# Patient Record
Sex: Female | Born: 1970 | Race: White | Hispanic: No | Marital: Married | Smoking: Never smoker
Health system: Southern US, Community
[De-identification: ages and names within clinical notes are randomized; demographics above are authoritative.]

## PROBLEM LIST (undated history)

## (undated) DIAGNOSIS — B009 Herpesviral infection, unspecified: Secondary | ICD-10-CM

## (undated) DIAGNOSIS — N946 Dysmenorrhea, unspecified: Secondary | ICD-10-CM

## (undated) DIAGNOSIS — D649 Anemia, unspecified: Secondary | ICD-10-CM

## (undated) DIAGNOSIS — IMO0002 Reserved for concepts with insufficient information to code with codable children: Secondary | ICD-10-CM

## (undated) DIAGNOSIS — D219 Benign neoplasm of connective and other soft tissue, unspecified: Secondary | ICD-10-CM

## (undated) DIAGNOSIS — D242 Benign neoplasm of left breast: Secondary | ICD-10-CM

## (undated) DIAGNOSIS — R87619 Unspecified abnormal cytological findings in specimens from cervix uteri: Secondary | ICD-10-CM

## (undated) DIAGNOSIS — R87629 Unspecified abnormal cytological findings in specimens from vagina: Secondary | ICD-10-CM

## (undated) DIAGNOSIS — N920 Excessive and frequent menstruation with regular cycle: Secondary | ICD-10-CM

## (undated) HISTORY — DX: Excessive and frequent menstruation with regular cycle: N92.0

## (undated) HISTORY — DX: Reserved for concepts with insufficient information to code with codable children: IMO0002

## (undated) HISTORY — DX: Unspecified abnormal cytological findings in specimens from vagina: R87.629

## (undated) HISTORY — PX: BREAST FIBROADENOMA SURGERY: SHX580

## (undated) HISTORY — DX: Benign neoplasm of left breast: D24.2

## (undated) HISTORY — DX: Benign neoplasm of connective and other soft tissue, unspecified: D21.9

## (undated) HISTORY — DX: Herpesviral infection, unspecified: B00.9

## (undated) HISTORY — DX: Unspecified abnormal cytological findings in specimens from cervix uteri: R87.619

## (undated) HISTORY — DX: Dysmenorrhea, unspecified: N94.6

---

## 2000-07-17 ENCOUNTER — Other Ambulatory Visit: Admission: RE | Admit: 2000-07-17 | Discharge: 2000-07-17 | Payer: Self-pay | Admitting: Obstetrics and Gynecology

## 2004-05-21 ENCOUNTER — Ambulatory Visit (HOSPITAL_COMMUNITY): Admission: RE | Admit: 2004-05-21 | Discharge: 2004-05-21 | Payer: Self-pay | Admitting: Family Medicine

## 2005-09-25 ENCOUNTER — Ambulatory Visit (HOSPITAL_COMMUNITY): Admission: RE | Admit: 2005-09-25 | Discharge: 2005-09-25 | Payer: Self-pay | Admitting: Family Medicine

## 2005-10-05 ENCOUNTER — Emergency Department (HOSPITAL_COMMUNITY): Admission: EM | Admit: 2005-10-05 | Discharge: 2005-10-05 | Payer: Self-pay | Admitting: Emergency Medicine

## 2007-03-08 ENCOUNTER — Other Ambulatory Visit: Admission: RE | Admit: 2007-03-08 | Discharge: 2007-03-08 | Payer: Self-pay | Admitting: Obstetrics and Gynecology

## 2007-03-08 LAB — CONVERTED CEMR LAB: Pap Smear: NORMAL

## 2007-05-06 ENCOUNTER — Ambulatory Visit: Payer: Self-pay | Admitting: Family Medicine

## 2007-05-06 DIAGNOSIS — J069 Acute upper respiratory infection, unspecified: Secondary | ICD-10-CM | POA: Insufficient documentation

## 2007-05-11 ENCOUNTER — Encounter (INDEPENDENT_AMBULATORY_CARE_PROVIDER_SITE_OTHER): Payer: Self-pay | Admitting: Family Medicine

## 2007-12-13 ENCOUNTER — Ambulatory Visit: Payer: Self-pay | Admitting: Family Medicine

## 2007-12-13 DIAGNOSIS — J00 Acute nasopharyngitis [common cold]: Secondary | ICD-10-CM | POA: Insufficient documentation

## 2008-02-15 ENCOUNTER — Telehealth (INDEPENDENT_AMBULATORY_CARE_PROVIDER_SITE_OTHER): Payer: Self-pay | Admitting: Internal Medicine

## 2008-04-03 ENCOUNTER — Other Ambulatory Visit: Admission: RE | Admit: 2008-04-03 | Discharge: 2008-04-03 | Payer: Self-pay | Admitting: Obstetrics and Gynecology

## 2008-12-06 ENCOUNTER — Encounter (INDEPENDENT_AMBULATORY_CARE_PROVIDER_SITE_OTHER): Payer: Self-pay | Admitting: Family Medicine

## 2009-09-04 ENCOUNTER — Other Ambulatory Visit
Admission: RE | Admit: 2009-09-04 | Discharge: 2009-09-04 | Payer: Self-pay | Source: Home / Self Care | Admitting: Obstetrics and Gynecology

## 2010-03-20 ENCOUNTER — Ambulatory Visit (HOSPITAL_COMMUNITY)
Admission: RE | Admit: 2010-03-20 | Discharge: 2010-03-20 | Payer: Self-pay | Source: Home / Self Care | Attending: Obstetrics & Gynecology | Admitting: Obstetrics & Gynecology

## 2010-09-11 ENCOUNTER — Other Ambulatory Visit: Payer: Self-pay | Admitting: Obstetrics and Gynecology

## 2010-09-11 DIAGNOSIS — Z09 Encounter for follow-up examination after completed treatment for conditions other than malignant neoplasm: Secondary | ICD-10-CM

## 2010-09-25 ENCOUNTER — Ambulatory Visit (HOSPITAL_COMMUNITY): Payer: 59

## 2010-09-25 ENCOUNTER — Ambulatory Visit (HOSPITAL_COMMUNITY)
Admission: RE | Admit: 2010-09-25 | Discharge: 2010-09-25 | Disposition: A | Discharge status: Home / Self Care | Payer: 59 | Source: Ambulatory Visit | Attending: Obstetrics and Gynecology | Admitting: Obstetrics and Gynecology

## 2010-09-25 DIAGNOSIS — N63 Unspecified lump in unspecified breast: Secondary | ICD-10-CM | POA: Insufficient documentation

## 2010-09-25 DIAGNOSIS — Z09 Encounter for follow-up examination after completed treatment for conditions other than malignant neoplasm: Secondary | ICD-10-CM | POA: Insufficient documentation

## 2010-12-30 ENCOUNTER — Inpatient Hospital Stay (INDEPENDENT_AMBULATORY_CARE_PROVIDER_SITE_OTHER)
Admission: RE | Admit: 2010-12-30 | Discharge: 2010-12-30 | Disposition: A | Discharge status: Home / Self Care | Payer: 59 | Source: Ambulatory Visit | Attending: Family Medicine | Admitting: Family Medicine

## 2010-12-30 DIAGNOSIS — R05 Cough: Secondary | ICD-10-CM

## 2010-12-30 DIAGNOSIS — R509 Fever, unspecified: Secondary | ICD-10-CM

## 2011-01-01 ENCOUNTER — Ambulatory Visit (INDEPENDENT_AMBULATORY_CARE_PROVIDER_SITE_OTHER): Payer: 59 | Admitting: Internal Medicine

## 2011-01-01 ENCOUNTER — Encounter: Payer: Self-pay | Admitting: Internal Medicine

## 2011-01-01 ENCOUNTER — Ambulatory Visit (INDEPENDENT_AMBULATORY_CARE_PROVIDER_SITE_OTHER)
Admission: RE | Admit: 2011-01-01 | Discharge: 2011-01-01 | Disposition: A | Discharge status: Home / Self Care | Payer: 59 | Source: Ambulatory Visit | Attending: Internal Medicine | Admitting: Internal Medicine

## 2011-01-01 VITALS — BP 124/72 | HR 88 | Temp 98.6°F | Resp 20 | Ht 62.0 in | Wt 158.0 lb

## 2011-01-01 DIAGNOSIS — J40 Bronchitis, not specified as acute or chronic: Secondary | ICD-10-CM

## 2011-01-01 DIAGNOSIS — J189 Pneumonia, unspecified organism: Secondary | ICD-10-CM

## 2011-01-01 MED ORDER — LEVOFLOXACIN 750 MG PO TABS: 750.0000 mg | ORAL_TABLET | Freq: Every day | ORAL | Status: AC

## 2011-01-01 MED ORDER — HYDROCODONE-ACETAMINOPHEN 7.5-500 MG PO TABS: 1.0000 | ORAL_TABLET | ORAL | Status: AC | PRN

## 2011-01-01 NOTE — Progress Notes (Signed)
  Subjective:    Patient ID: Victoria Garrison, female    DOB: 1970/07/16, 40 y.o.   MRN: [id number]  HPI 40 year old female presents with a six-day history of fever, malaise, and cough. She was seen at urgent care on Monday and diagnosed with viral syndrome. She reports that symptoms have worsened over the last 48 hours. She's had fever as high as 101 Fahrenheit. She reports malaise and fatigue. She reports chronic cough which has not improved with codeine and Mucinex. She reports that cough is productive of purulent sputum. She denies any chest pain.   Review of Systems  Constitutional: Positive for fever, chills and fatigue.  HENT: Positive for congestion, rhinorrhea and sinus pressure. Negative for neck pain.   Respiratory: Positive for cough. Negative for shortness of breath and wheezing.   Cardiovascular: Negative for chest pain.  Gastrointestinal: Negative for nausea, diarrhea and constipation.  Musculoskeletal: Positive for myalgias.  Skin: Positive for wound (fever blister).   BP 124/72  Pulse 88  Temp(Src) 98.6 F (37 C) (Oral)  Resp 20  Ht 5\' 2"  (1.575 m)  Wt 158 lb (71.668 kg)  BMI 28.90 kg/m2  SpO2 95%  LMP 01/01/2011     Objective:   Physical Exam  Constitutional: She is oriented to person, place, and time. She appears well-developed and well-nourished. No distress.  HENT:  Head: Normocephalic and atraumatic.  Right Ear: Tympanic membrane and external ear normal.  Left Ear: Tympanic membrane and external ear normal.  Nose: Nose normal.  Mouth/Throat: Posterior oropharyngeal erythema present. No oropharyngeal exudate.    Eyes: Conjunctivae are normal. Pupils are equal, round, and reactive to light. Right eye exhibits no discharge. Left eye exhibits no discharge. No scleral icterus.  Neck: Normal range of motion. Neck supple. No tracheal deviation present. No thyromegaly present.  Cardiovascular: Normal rate, regular rhythm, normal heart sounds and intact distal  pulses.  Exam reveals no gallop and no friction rub.   No murmur heard. Pulmonary/Chest: Effort normal. No accessory muscle usage. Not tachypneic. No respiratory distress. She has no decreased breath sounds. She has no wheezes. She has rhonchi in the right lower field and the left lower field. She has no rales.   She exhibits no tenderness.  Musculoskeletal: Normal range of motion. She exhibits no edema and no tenderness.  Lymphadenopathy:    She has no cervical adenopathy.  Neurological: She is alert and oriented to person, place, and time. No cranial nerve deficit. She exhibits normal muscle tone. Coordination normal.  Skin: Skin is warm and dry. No rash noted. She is not diaphoretic. No erythema. No pallor.  Psychiatric: She has a normal mood and affect. Her behavior is normal. Judgment and thought content normal.     CXR - LLL pneumonia     Assessment & Plan:  1. Pneumonia - patient with clinical presentation and exam most consistent with bronchitis and possible pneumonia. Chest x-ray showed left lower lobe infiltrate. Will treat with Levaquin x7 days. She will continue to use Tylenol and ibuprofen as needed. We will add Vicodin for cough given that she is allergic to Tussionex. She will call or return to clinic should fever not resolve within the next 24 hours.

## 2011-01-03 ENCOUNTER — Other Ambulatory Visit: Payer: Self-pay | Admitting: Internal Medicine

## 2011-01-03 MED ORDER — AZITHROMYCIN 250 MG PO TABS: ORAL_TABLET | ORAL | Status: AC

## 2011-01-03 MED ORDER — CEPHALEXIN 500 MG PO CAPS: 500.0000 mg | ORAL_CAPSULE | Freq: Three times a day (TID) | ORAL | Status: AC

## 2011-01-06 ENCOUNTER — Other Ambulatory Visit: Payer: Self-pay | Admitting: Internal Medicine

## 2011-01-06 ENCOUNTER — Ambulatory Visit (INDEPENDENT_AMBULATORY_CARE_PROVIDER_SITE_OTHER)
Admission: RE | Admit: 2011-01-06 | Discharge: 2011-01-06 | Disposition: A | Discharge status: Home / Self Care | Payer: 59 | Source: Ambulatory Visit | Attending: Internal Medicine | Admitting: Internal Medicine

## 2011-01-06 DIAGNOSIS — J189 Pneumonia, unspecified organism: Secondary | ICD-10-CM

## 2011-02-18 ENCOUNTER — Other Ambulatory Visit: Payer: Self-pay | Admitting: Internal Medicine

## 2011-02-18 DIAGNOSIS — J189 Pneumonia, unspecified organism: Secondary | ICD-10-CM

## 2011-02-18 MED ORDER — DOXYCYCLINE HYCLATE 100 MG PO CAPS: 100.0000 mg | ORAL_CAPSULE | Freq: Two times a day (BID) | ORAL | Status: AC

## 2011-03-13 ENCOUNTER — Other Ambulatory Visit: Payer: Self-pay | Admitting: Internal Medicine

## 2011-03-13 DIAGNOSIS — IMO0001 Reserved for inherently not codable concepts without codable children: Secondary | ICD-10-CM

## 2011-03-13 MED ORDER — LEVONORGEST-ETH ESTRAD 91-DAY 0.15-0.03 &0.01 MG PO TABS: 1.0000 | ORAL_TABLET | Freq: Every day | ORAL | Status: DC

## 2011-05-16 ENCOUNTER — Other Ambulatory Visit: Payer: Self-pay | Admitting: Adult Health

## 2011-05-16 DIAGNOSIS — N63 Unspecified lump in unspecified breast: Secondary | ICD-10-CM

## 2011-05-16 DIAGNOSIS — Z09 Encounter for follow-up examination after completed treatment for conditions other than malignant neoplasm: Secondary | ICD-10-CM

## 2011-05-23 ENCOUNTER — Other Ambulatory Visit: Payer: Self-pay | Admitting: Obstetrics & Gynecology

## 2011-05-23 DIAGNOSIS — N63 Unspecified lump in unspecified breast: Secondary | ICD-10-CM

## 2011-05-28 ENCOUNTER — Ambulatory Visit
Admission: RE | Admit: 2011-05-28 | Discharge: 2011-05-28 | Disposition: A | Discharge status: Home / Self Care | Payer: 59 | Source: Ambulatory Visit | Attending: Adult Health | Admitting: Adult Health

## 2011-05-28 DIAGNOSIS — N63 Unspecified lump in unspecified breast: Secondary | ICD-10-CM

## 2011-07-18 ENCOUNTER — Ambulatory Visit (HOSPITAL_COMMUNITY)
Admission: RE | Admit: 2011-07-18 | Discharge: 2011-07-18 | Disposition: A | Discharge status: Home / Self Care | Payer: 59 | Source: Ambulatory Visit | Attending: Internal Medicine | Admitting: Internal Medicine

## 2011-07-18 ENCOUNTER — Other Ambulatory Visit: Payer: Self-pay | Admitting: Internal Medicine

## 2011-07-18 DIAGNOSIS — R079 Chest pain, unspecified: Secondary | ICD-10-CM | POA: Insufficient documentation

## 2011-07-18 DIAGNOSIS — R05 Cough: Secondary | ICD-10-CM | POA: Insufficient documentation

## 2011-07-18 DIAGNOSIS — R059 Cough, unspecified: Secondary | ICD-10-CM | POA: Insufficient documentation

## 2011-09-25 ENCOUNTER — Other Ambulatory Visit: Payer: Self-pay | Admitting: Internal Medicine

## 2011-09-25 MED ORDER — AMOXICILLIN-POT CLAVULANATE 875-125 MG PO TABS: 1.0000 | ORAL_TABLET | Freq: Two times a day (BID) | ORAL | Status: AC

## 2011-11-11 ENCOUNTER — Other Ambulatory Visit (HOSPITAL_COMMUNITY)
Admission: RE | Admit: 2011-11-11 | Discharge: 2011-11-11 | Disposition: A | Discharge status: Home / Self Care | Payer: 59 | Source: Ambulatory Visit | Attending: Obstetrics and Gynecology | Admitting: Obstetrics and Gynecology

## 2011-11-11 ENCOUNTER — Other Ambulatory Visit: Payer: Self-pay | Admitting: Adult Health

## 2011-11-11 DIAGNOSIS — Z01419 Encounter for gynecological examination (general) (routine) without abnormal findings: Secondary | ICD-10-CM | POA: Insufficient documentation

## 2011-11-11 DIAGNOSIS — Z1151 Encounter for screening for human papillomavirus (HPV): Secondary | ICD-10-CM | POA: Insufficient documentation

## 2011-12-01 ENCOUNTER — Other Ambulatory Visit: Payer: Self-pay | Admitting: Internal Medicine

## 2011-12-01 ENCOUNTER — Other Ambulatory Visit (INDEPENDENT_AMBULATORY_CARE_PROVIDER_SITE_OTHER): Payer: 59

## 2011-12-01 DIAGNOSIS — N920 Excessive and frequent menstruation with regular cycle: Secondary | ICD-10-CM

## 2011-12-01 LAB — CBC WITH DIFFERENTIAL/PLATELET
Basophils Absolute: 0 10*3/uL (ref 0.0–0.1)
Eosinophils Absolute: 0.1 10*3/uL (ref 0.0–0.7)
Hemoglobin: 12.3 g/dL (ref 12.0–15.0)
Lymphs Abs: 2.3 10*3/uL (ref 0.7–4.0)
MCV: 85 fl (ref 78.0–100.0)
Monocytes Absolute: 0.6 10*3/uL (ref 0.1–1.0)
Neutro Abs: 4.5 10*3/uL (ref 1.4–7.7)
Platelets: 354 10*3/uL (ref 150.0–400.0)
RBC: 4.44 Mil/uL (ref 3.87–5.11)
RDW: 13.1 % (ref 11.5–14.6)

## 2012-01-05 ENCOUNTER — Other Ambulatory Visit: Payer: Self-pay | Admitting: Internal Medicine

## 2012-01-05 DIAGNOSIS — H109 Unspecified conjunctivitis: Secondary | ICD-10-CM

## 2012-01-29 ENCOUNTER — Other Ambulatory Visit: Payer: Self-pay | Admitting: Internal Medicine

## 2012-03-17 HISTORY — PX: CHOLECYSTECTOMY: SHX55

## 2012-03-22 ENCOUNTER — Ambulatory Visit (INDEPENDENT_AMBULATORY_CARE_PROVIDER_SITE_OTHER): Payer: 59 | Admitting: Internal Medicine

## 2012-03-22 ENCOUNTER — Encounter: Payer: Self-pay | Admitting: Internal Medicine

## 2012-03-22 ENCOUNTER — Telehealth: Payer: Self-pay | Admitting: *Deleted

## 2012-03-22 VITALS — BP 120/82 | HR 96 | Temp 97.8°F | Ht 62.0 in

## 2012-03-22 DIAGNOSIS — K810 Acute cholecystitis: Secondary | ICD-10-CM

## 2012-03-22 DIAGNOSIS — R1011 Right upper quadrant pain: Secondary | ICD-10-CM | POA: Insufficient documentation

## 2012-03-22 LAB — CBC WITH DIFFERENTIAL/PLATELET
Basophils Absolute: 0.1 10*3/uL (ref 0.0–0.1)
Basophils Relative: 0.4 % (ref 0.0–3.0)
HCT: 35.6 % — ABNORMAL LOW (ref 36.0–46.0)
Lymphocytes Relative: 19.7 % (ref 12.0–46.0)
Lymphs Abs: 2.6 10*3/uL (ref 0.7–4.0)
Monocytes Relative: 8.4 % (ref 3.0–12.0)
Neutrophils Relative %: 70.6 % (ref 43.0–77.0)
Platelets: 415 10*3/uL — ABNORMAL HIGH (ref 150.0–400.0)
RBC: 4.55 Mil/uL (ref 3.87–5.11)

## 2012-03-22 LAB — COMPREHENSIVE METABOLIC PANEL
AST: 21 U/L (ref 0–37)
Albumin: 3.5 g/dL (ref 3.5–5.2)
Calcium: 8.6 mg/dL (ref 8.4–10.5)
GFR: 96.87 mL/min (ref >60.00)
Potassium: 3.9 mEq/L (ref 3.5–5.1)
Sodium: 137 mEq/L (ref 135–145)
Total Protein: 7.3 g/dL (ref 6.0–8.3)

## 2012-03-22 LAB — POCT URINALYSIS DIPSTICK
Bilirubin, UA: NEGATIVE
Leukocytes, UA: NEGATIVE
Spec Grav, UA: 1.01
pH, UA: 6

## 2012-03-22 MED ORDER — HYDROCODONE-ACETAMINOPHEN 5-500 MG PO TABS: 1.0000 | ORAL_TABLET | Freq: Three times a day (TID) | ORAL | Status: DC | PRN

## 2012-03-22 NOTE — Telephone Encounter (Signed)
Yes, please.

## 2012-03-22 NOTE — Addendum Note (Signed)
Addended by: Montine Circle D on: 03/22/2012 02:12 PM   Modules accepted: Orders

## 2012-03-22 NOTE — Telephone Encounter (Signed)
For this pt would you like a urine culture?

## 2012-03-22 NOTE — Assessment & Plan Note (Signed)
Symptoms and exam are most consistent with cholecystitis. Will the right upper quadrant ultrasound for evaluation. We'll also send CMP, CBC with labs. Urinalysis is positive for blood the patient has had some menstrual bleeding and has no urinary symptoms. If right upper quadrant ultrasound is negative, would prefer to get CT of the abdomen for further evaluation were both nephrolithiasis and appendicitis.

## 2012-03-22 NOTE — Addendum Note (Signed)
Addended by: Ronna Polio A on: 03/22/2012 04:58 PM   Modules accepted: Orders

## 2012-03-22 NOTE — Addendum Note (Signed)
Addended by: Ronna Polio A on: 03/22/2012 05:34 PM   Modules accepted: Orders

## 2012-03-22 NOTE — Progress Notes (Signed)
Subjective:    Patient ID: Victoria Garrison, female    DOB: 11-27-1970, 42 y.o.   MRN: [id number]  HPI 42 year old female presents for acute visit complaining of three-day history of right upper quadrant abdominal pain. She reports that symptoms first began suddenly on Saturday. Abdominal pain is described as sharp sometimes aching pain located in the right upper quadrant. It is worsened with movement or with eating. She had some nausea and dry heaves on Saturday as well. She felt that her abdomen was distended. She denies any diarrhea, blood in her stool, fever, chills. She has not been able to yesterday or today because of nausea. Pain is intermittent and severe. It continues to be worsened with movement. She denies any known sick contacts.  Outpatient Encounter Prescriptions as of 03/22/2012  Medication Sig Dispense Refill  . Levonorgestrel-Ethinyl Estradiol (AMETHIA,CAMRESE) 0.15-0.03 &0.01 MG tablet Take 1 tablet by mouth daily.  1 Package  4   BP 120/82  Pulse 96  Temp 97.8 F (36.6 C) (Oral)  Ht 5\' 2"  (1.575 m)  SpO2 98%  LMP 03/01/2012  Review of Systems  Constitutional: Negative for fever, chills, appetite change, fatigue and unexpected weight change.  HENT: Negative for ear pain, congestion, sore throat, trouble swallowing, neck pain, voice change and sinus pressure.   Eyes: Negative for visual disturbance.  Respiratory: Negative for cough, shortness of breath, wheezing and stridor.   Cardiovascular: Negative for chest pain, palpitations and leg swelling.  Gastrointestinal: Positive for nausea, abdominal pain and abdominal distention. Negative for vomiting, diarrhea, constipation, blood in stool and anal bleeding.  Genitourinary: Negative for dysuria and flank pain.  Musculoskeletal: Negative for myalgias, arthralgias and gait problem.  Skin: Negative for color change and rash.  Neurological: Negative for dizziness and headaches.  Hematological: Negative for adenopathy. Does not  bruise/bleed easily.  Psychiatric/Behavioral: Negative for suicidal ideas, sleep disturbance and dysphoric mood. The patient is not nervous/anxious.        Objective:   Physical Exam  Constitutional: She is oriented to person, place, and time. She appears well-developed and well-nourished. No distress.  HENT:  Head: Normocephalic and atraumatic.  Right Ear: External ear normal.  Left Ear: External ear normal.  Nose: Nose normal.  Mouth/Throat: Oropharynx is clear and moist.  Eyes: Conjunctivae normal are normal. Pupils are equal, round, and reactive to light. Right eye exhibits no discharge. Left eye exhibits no discharge. No scleral icterus.  Neck: Normal range of motion. Neck supple. No tracheal deviation present. No thyromegaly present.  Cardiovascular: Normal rate, regular rhythm, normal heart sounds and intact distal pulses.  Exam reveals no gallop and no friction rub.   No murmur heard. Pulmonary/Chest: Effort normal and breath sounds normal. No respiratory distress. She has no wheezes. She has no rales. She exhibits no tenderness.  Abdominal: Soft. Bowel sounds are normal. She exhibits no distension and no mass. There is tenderness (right upper quad). There is guarding. There is no rebound.  Musculoskeletal: Normal range of motion. She exhibits no edema and no tenderness.  Lymphadenopathy:    She has no cervical adenopathy.  Neurological: She is alert and oriented to person, place, and time. No cranial nerve deficit. She exhibits normal muscle tone. Coordination normal.  Skin: Skin is warm and dry. No rash noted. She is not diaphoretic. No erythema. No pallor.  Psychiatric: She has a normal mood and affect. Her behavior is normal. Judgment and thought content normal.          Assessment &  Plan:

## 2012-03-23 ENCOUNTER — Ambulatory Visit: Payer: 59 | Admitting: Internal Medicine

## 2012-03-24 ENCOUNTER — Ambulatory Visit: Payer: Self-pay | Admitting: Surgery

## 2012-03-29 ENCOUNTER — Ambulatory Visit (INDEPENDENT_AMBULATORY_CARE_PROVIDER_SITE_OTHER): Payer: Self-pay | Admitting: Surgery

## 2012-03-31 ENCOUNTER — Encounter: Payer: Self-pay | Admitting: Internal Medicine

## 2012-04-21 ENCOUNTER — Encounter: Payer: Self-pay | Admitting: Internal Medicine

## 2012-04-21 ENCOUNTER — Ambulatory Visit (INDEPENDENT_AMBULATORY_CARE_PROVIDER_SITE_OTHER): Payer: 59 | Admitting: Internal Medicine

## 2012-04-21 VITALS — BP 104/64 | HR 98 | Temp 98.7°F | Ht 62.0 in | Wt 154.8 lb

## 2012-04-21 DIAGNOSIS — J40 Bronchitis, not specified as acute or chronic: Secondary | ICD-10-CM

## 2012-04-21 MED ORDER — BENZONATATE 200 MG PO CAPS: 200.0000 mg | ORAL_CAPSULE | Freq: Two times a day (BID) | ORAL | Status: DC | PRN

## 2012-04-21 MED ORDER — AZITHROMYCIN 250 MG PO TABS: ORAL_TABLET | ORAL | Status: DC

## 2012-04-21 NOTE — Assessment & Plan Note (Signed)
Symptoms and exam are consistent with bronchitis and early sinusitis. Will treat with azithromycin and use tessalon for cough. Recommended Claritin D for congestion. Pt will call if symptoms not improving.

## 2012-04-21 NOTE — Progress Notes (Signed)
  Subjective:    Patient ID: Victoria Garrison, female    DOB: 04/24/1970, 42 y.o.   MRN: [id number]  HPI 42YO female presents for urgent visit complains of runny nose, sinus pressure, neck pain, watery eyes, sore throat, cough productive of purulent sputum for 4 days. No fever, chills, dyspnea, chest pain. Son is sick with similar symptoms. Took OTC cough meds with no improvement.  Outpatient Encounter Prescriptions as of 04/21/2012  Medication Sig Dispense Refill  . Levonorgestrel-Ethinyl Estradiol (AMETHIA,CAMRESE) 0.15-0.03 &0.01 MG tablet Take 1 tablet by mouth daily.  1 Package  4  . [DISCONTINUED] HYDROcodone-acetaminophen (VICODIN) 5-500 MG per tablet Take 1-2 tablets by mouth every 8 (eight) hours as needed for pain.  60 tablet  0    Review of Systems  Constitutional: Positive for fatigue. Negative for fever, chills and unexpected weight change.  HENT: Positive for congestion, rhinorrhea and postnasal drip. Negative for hearing loss, ear pain, nosebleeds, sore throat, facial swelling, sneezing, mouth sores, trouble swallowing, neck pain, neck stiffness, voice change, sinus pressure, tinnitus and ear discharge.   Eyes: Negative for pain, discharge, redness and visual disturbance.  Respiratory: Positive for cough. Negative for chest tightness, shortness of breath, wheezing and stridor.   Cardiovascular: Negative for chest pain, palpitations and leg swelling.  Musculoskeletal: Negative for myalgias and arthralgias.  Skin: Negative for color change and rash.  Neurological: Negative for dizziness, weakness, light-headedness and headaches.  Hematological: Negative for adenopathy.       Objective:   Physical Exam  Constitutional: She is oriented to person, place, and time. She appears well-developed and well-nourished. No distress.  HENT:  Head: Normocephalic and atraumatic.  Right Ear: External ear normal. Tympanic membrane is not bulging. A middle ear effusion is present.  Left Ear:  External ear normal. Tympanic membrane is not bulging. A middle ear effusion is present.  Nose: Mucosal edema present.  Mouth/Throat: Oropharynx is clear and moist. No oropharyngeal exudate.  Eyes: Conjunctivae normal are normal. Pupils are equal, round, and reactive to light. Right eye exhibits no discharge. Left eye exhibits no discharge. No scleral icterus.  Neck: Normal range of motion. Neck supple. No tracheal deviation present. No thyromegaly present.  Cardiovascular: Normal rate, regular rhythm, normal heart sounds and intact distal pulses.  Exam reveals no gallop and no friction rub.   No murmur heard. Pulmonary/Chest: Effort normal. No accessory muscle usage. Not tachypneic. No respiratory distress. She has no decreased breath sounds. She has no wheezes. She has rhonchi (scattered). She has no rales. She exhibits no tenderness.  Musculoskeletal: Normal range of motion. She exhibits no edema and no tenderness.  Lymphadenopathy:    She has no cervical adenopathy.  Neurological: She is alert and oriented to person, place, and time. No cranial nerve deficit. She exhibits normal muscle tone. Coordination normal.  Skin: Skin is warm and dry. No rash noted. She is not diaphoretic. No erythema. No pallor.  Psychiatric: She has a normal mood and affect. Her behavior is normal. Judgment and thought content normal.          Assessment & Plan:

## 2012-04-22 ENCOUNTER — Ambulatory Visit: Payer: 59 | Admitting: Internal Medicine

## 2012-05-01 ENCOUNTER — Other Ambulatory Visit: Payer: Self-pay

## 2012-05-05 ENCOUNTER — Other Ambulatory Visit: Payer: Self-pay | Admitting: Obstetrics and Gynecology

## 2012-05-05 DIAGNOSIS — N6320 Unspecified lump in the left breast, unspecified quadrant: Secondary | ICD-10-CM

## 2012-05-28 ENCOUNTER — Ambulatory Visit
Admission: RE | Admit: 2012-05-28 | Discharge: 2012-05-28 | Disposition: A | Discharge status: Home / Self Care | Payer: 59 | Source: Ambulatory Visit | Attending: Obstetrics and Gynecology | Admitting: Obstetrics and Gynecology

## 2012-05-28 ENCOUNTER — Other Ambulatory Visit: Payer: Self-pay | Admitting: Obstetrics and Gynecology

## 2012-05-28 DIAGNOSIS — N6320 Unspecified lump in the left breast, unspecified quadrant: Secondary | ICD-10-CM

## 2012-05-31 ENCOUNTER — Other Ambulatory Visit: Payer: Self-pay | Admitting: *Deleted

## 2012-05-31 MED ORDER — LEVONORGEST-ETH ESTRAD 91-DAY 0.15-0.03 &0.01 MG PO TABS: 1.0000 | ORAL_TABLET | Freq: Every day | ORAL | Status: DC

## 2012-05-31 NOTE — Telephone Encounter (Signed)
Eprescribed.

## 2012-08-13 ENCOUNTER — Other Ambulatory Visit: Payer: Self-pay | Admitting: Internal Medicine

## 2012-08-13 ENCOUNTER — Other Ambulatory Visit (INDEPENDENT_AMBULATORY_CARE_PROVIDER_SITE_OTHER): Payer: 59 | Admitting: *Deleted

## 2012-08-13 DIAGNOSIS — N390 Urinary tract infection, site not specified: Secondary | ICD-10-CM

## 2012-08-13 LAB — POCT URINALYSIS DIPSTICK
Bilirubin, UA: NEGATIVE
Glucose, UA: NEGATIVE
Nitrite, UA: NEGATIVE
Urobilinogen, UA: 0.2

## 2012-08-13 MED ORDER — CIPROFLOXACIN HCL 500 MG PO TABS: 500.0000 mg | ORAL_TABLET | Freq: Two times a day (BID) | ORAL | Status: DC

## 2012-08-16 LAB — URINE CULTURE: Colony Count: 10000

## 2012-09-01 ENCOUNTER — Encounter: Payer: Self-pay | Admitting: *Deleted

## 2012-09-02 ENCOUNTER — Encounter: Payer: Self-pay | Admitting: Adult Health

## 2012-09-02 ENCOUNTER — Ambulatory Visit (INDEPENDENT_AMBULATORY_CARE_PROVIDER_SITE_OTHER): Payer: 59 | Admitting: Adult Health

## 2012-09-02 VITALS — BP 122/80 | Ht 62.0 in | Wt 154.0 lb

## 2012-09-02 DIAGNOSIS — D259 Leiomyoma of uterus, unspecified: Secondary | ICD-10-CM | POA: Insufficient documentation

## 2012-09-02 DIAGNOSIS — N920 Excessive and frequent menstruation with regular cycle: Secondary | ICD-10-CM

## 2012-09-02 DIAGNOSIS — D219 Benign neoplasm of connective and other soft tissue, unspecified: Secondary | ICD-10-CM

## 2012-09-02 HISTORY — DX: Benign neoplasm of connective and other soft tissue, unspecified: D21.9

## 2012-09-02 LAB — CBC
Hemoglobin: 11.4 g/dL — ABNORMAL LOW (ref 12.0–15.0)
MCH: 25.1 pg — ABNORMAL LOW (ref 26.0–34.0)
MCHC: 33.7 g/dL (ref 30.0–36.0)
RDW: 14.4 % (ref 11.5–15.5)

## 2012-09-02 MED ORDER — MEGESTROL ACETATE 40 MG PO TABS: ORAL_TABLET | ORAL | Status: DC

## 2012-09-02 NOTE — Patient Instructions (Addendum)
Menorrhagia Dysfunctional uterine bleeding is different from a normal menstrual period. When periods are heavy or there is more bleeding than is usual for you, it is called menorrhagia. It may be caused by hormonal imbalance, or physical, metabolic, or other problems. Examination is necessary in order that your caregiver may treat treatable causes. If this is a continuing problem, a D&C may be needed. That means that the cervix (the opening of the uterus or womb) is dilated (stretched larger) and the lining of the uterus is scraped out. The tissue scraped out is then examined under a microscope by a specialist (pathologist) to make sure there is nothing of concern that needs further or more extensive treatment. HOME CARE INSTRUCTIONS   If medications were prescribed, take exactly as directed. Do not change or switch medications without consulting your caregiver.  Long term heavy bleeding may result in iron deficiency. Your caregiver may have prescribed iron pills. They help replace the iron your body lost from heavy bleeding. Take exactly as directed. Iron may cause constipation. If this becomes a problem, increase the bran, fruits, and roughage in your diet.  Do not take aspirin or medicines that contain aspirin one week before or during your menstrual period. Aspirin may make the bleeding worse.  If you need to change your sanitary pad or tampon more than once every 2 hours, stay in bed and rest as much as possible until the bleeding stops.  Eat well-balanced meals. Eat foods high in iron. Examples are leafy green vegetables, meat, liver, eggs, and whole grain breads and cereals. Do not try to lose weight until the abnormal bleeding has stopped and your blood iron level is back to normal. SEEK MEDICAL CARE IF:   You need to change your sanitary pad or tampon more than once an hour.  You develop nausea (feeling sick to your stomach) and vomiting, dizziness, or diarrhea while you are taking your  medicine.  You have any problems that may be related to the medicine you are taking. SEEK IMMEDIATE MEDICAL CARE IF:   You have a fever.  You develop chills.  You develop severe bleeding or start to pass blood clots.  You feel dizzy or faint. MAKE SURE YOU:   Understand these instructions.  Will watch your condition.  Will get help right away if you are not doing well or get worse. Document Released: 03/03/2005 Document Revised: 05/26/2011 Document Reviewed: 10/22/2007 [facility] Patient Information 2014 [place], [place]. Take megace return in 1 week for [place]  And see me

## 2012-09-02 NOTE — Progress Notes (Signed)
Subjective:     Patient ID: Victoria Garrison, female   DOB: January 19, 1971, 42 y.o.   MRN: [id number]  HPI Victoria Garrison is a 42 year old white female in complaining of bleeding heavy since 08/26/12,  she has had to change every 30 minutes to 1 hour at times and has had clots.On 6/17 she had a gush of blood then almost no bleeding the 18th  Til night time.She has known small multiple fibroids.She also has had bleeding after sex.She has been on generic seasonique, but is off of it x 1 week Husband has had a vasectomy.  Review of Systems Positives as in HPI Reviewed past medical,surgical, social and family history. Reviewed medications and allergies.     Objective:   Physical Exam BP 122/80  Ht 5\' 2"  (1.575 m)  Wt 154 lb (69.854 kg)  BMI 28.16 kg/m2  LMP 06/12/2014Talk only.Will get [place] and check labs.And will start megace today.Discussed with Dr. Despina Hidden     Assessment:      Menorrhagia Uterine fibroids    Plan:      Return in 1 week for [place]  Check CBC,CMP and TSH Rx Megace 40 mg 3 x 5 days then 2 x 5 days then 1 daily # 45 with 1 refill

## 2012-09-03 ENCOUNTER — Other Ambulatory Visit: Payer: Self-pay | Admitting: Internal Medicine

## 2012-09-03 ENCOUNTER — Encounter: Payer: Self-pay | Admitting: Adult Health

## 2012-09-03 ENCOUNTER — Other Ambulatory Visit: Payer: Self-pay | Admitting: *Deleted

## 2012-09-03 DIAGNOSIS — N920 Excessive and frequent menstruation with regular cycle: Secondary | ICD-10-CM

## 2012-09-03 LAB — COMPREHENSIVE METABOLIC PANEL
AST: 27 U/L (ref 0–37)
Alkaline Phosphatase: 55 U/L (ref 39–117)
Glucose, Bld: 88 mg/dL (ref 70–99)
Potassium: 4.3 mEq/L (ref 3.5–5.3)
Sodium: 139 mEq/L (ref 135–145)
Total Bilirubin: 0.1 mg/dL — ABNORMAL LOW (ref 0.3–1.2)
Total Protein: 6.4 g/dL (ref 6.0–8.3)

## 2012-09-08 LAB — VON WILLEBRAND PANEL
Coagulation Factor VIII: 119 % (ref 73–140)
Von Willebrand Antigen, Plasma: 93 % (ref 50–217)

## 2012-09-13 ENCOUNTER — Ambulatory Visit: Payer: 59 | Admitting: Adult Health

## 2012-09-13 ENCOUNTER — Other Ambulatory Visit: Payer: 59

## 2012-09-14 ENCOUNTER — Encounter: Payer: Self-pay | Admitting: Adult Health

## 2012-09-14 ENCOUNTER — Ambulatory Visit (INDEPENDENT_AMBULATORY_CARE_PROVIDER_SITE_OTHER): Payer: 59 | Admitting: Adult Health

## 2012-09-14 ENCOUNTER — Other Ambulatory Visit: Payer: 59

## 2012-09-14 VITALS — BP 120/78 | Ht 62.0 in | Wt 156.0 lb

## 2012-09-14 DIAGNOSIS — D219 Benign neoplasm of connective and other soft tissue, unspecified: Secondary | ICD-10-CM

## 2012-09-14 DIAGNOSIS — D259 Leiomyoma of uterus, unspecified: Secondary | ICD-10-CM

## 2012-09-14 DIAGNOSIS — N920 Excessive and frequent menstruation with regular cycle: Secondary | ICD-10-CM

## 2012-09-14 NOTE — Progress Notes (Signed)
Subjective:     Patient ID: Victoria Garrison, female   DOB: [birth year], 42 y.o.   MRN: [id number]  HPI Raaga is in to discuss bleeding and treatment options.The bleeding has stopped, and she did not have to take the megace.She is still off her OCs.Husband had vasectomy.  Review of Systems Positives in HPI    Reviewed past medical,surgical, social and family history. Reviewed medications and allergies.  Objective:   Physical Exam BP 120/78  Ht 5\' 2"  (1.575 m)  Wt 156 lb (70.761 kg)  BMI 28.53 kg/m2  LMP 08/26/2012   Discussed endo ablation vs IUD, leaning toward ablation. Assessment:      Menorrhagia Fibroids    Plan:      Return in 1 week to see Dr Emelda Fear and discuss options Review handout on endo ablation

## 2012-09-14 NOTE — Patient Instructions (Addendum)
Return in 1 week to see jVFEndometrial Ablation Endometrial ablation removes the lining of the uterus (endometrium). It is usually a same day, outpatient treatment. Ablation helps avoid major surgery (such as a hysterectomy). A hysterectomy is removal of the cervix and uterus. Endometrial ablation has less risk and complications, has a shorter recovery period and is less expensive. After endometrial ablation, most women will have little or no menstrual bleeding. You may not keep your fertility. Pregnancy is no longer likely after this procedure but if you are pre-menopausal, you still need to use a reliable method of birth control following the procedure because pregnancy can occur. REASONS TO HAVE THE PROCEDURE MAY INCLUDE:  Heavy periods.  Bleeding that is causing anemia.  Anovulatory bleeding, very irregular, bleeding.  Bleeding submucous fibroids (on the lining inside the uterus) if they are smaller than 3 centimeters. REASONS NOT TO HAVE THE PROCEDURE MAY INCLUDE:  You wish to have more children.  You have a pre-cancerous or cancerous problem. The cause of any abnormal bleeding must be diagnosed before having the procedure.  You have pain coming from the uterus.  You have a submucus fibroid larger than 3 centimeters.  You recently had a baby.  You recently had an infection in the uterus.  You have a severe retro-flexed, tipped uterus and cannot insert the instrument to do the ablation.  You had a Cesarean section or deep major surgery on the uterus.  The inner cavity of the uterus is too large for the endometrial ablation instrument. RISKS AND COMPLICATIONS   Perforation of the uterus.  Bleeding.  Infection of the uterus, bladder or vagina.  Injury to surrounding organs.  Cutting the cervix.  An air bubble to the lung (air embolus).  Pregnancy following the procedure.  Failure of the procedure to help the problem requiring hysterectomy.  Decreased ability to  diagnose cancer in the lining of the uterus. BEFORE THE PROCEDURE  The lining of the uterus must be tested to make sure there is no pre-cancerous or cancer cells present.  Medications may be given to make the lining of the uterus thinner.  Ultrasound may be used to evaluate the size and look for abnormalities of the uterus.  Future pregnancy is not desired. PROCEDURE  There are different ways to destroy the lining of the uterus.   Resectoscope - radio frequency-alternating electric current is the most common one used.  Cryotherapy - freezing the lining of the uterus.  Heated Free Liquid - heated salt (saline) solution inserted into the uterus.  Microwave - uses high energy microwaves in the uterus.  Thermal Balloon - a catheter with a balloon tip is inserted into the uterus and filled with heated fluid. Your caregiver will talk with you about the method used in this clinic. They will also instruct you on the pros and cons of the procedure. Endometrial ablation is performed along with a procedure called operative hysteroscopy. A narrow viewing tube is inserted through the birth canal (vagina) and through the cervix into the uterus. A tiny camera attached to the viewing tube (hysteroscope) allows the uterine cavity to be shown on a TV monitor during surgery. Your uterus is filled with a harmless liquid to make the procedure easier. The lining of the uterus is then removed. The lining can also be removed with a resectoscope which allows your surgeon to cut away the lining of the uterus under direct vision. Usually, you will be able to go home within an hour after the procedure.  HOME CARE INSTRUCTIONS   Do not drive for 24 hours.  No tampons, douching or intercourse for 2 weeks or until your caregiver approves.  Rest at home for 24 to 48 hours. You may then resume normal activities unless told differently by your caregiver.  Take your temperature two times a day for 4 days, and record  it.  Take any medications your caregiver has ordered, as directed.  Use some form of contraception if you are pre-menopausal and do not want to get pregnant. Bleeding after the procedure is normal. It varies from light spotting and mildly watery to bloody discharge for 4 to 6 weeks. You may also have mild cramping. Only take over-the-counter or prescription medicines for pain, discomfort, or fever as directed by your caregiver. Do not use aspirin, as this may aggravate bleeding. Frequent urination during the first 24 hours is normal. You will not know how effective your surgery is until at least 3 months after the surgery. SEEK IMMEDIATE MEDICAL CARE IF:   Bleeding is heavier than a normal menstrual cycle.  An oral temperature above 102 F (38.9 C) develops.  You have increasing cramps or pains not relieved with medication or develop belly (abdominal) pain which does not seem to be related to the same area of earlier cramping and pain.  You are light headed, weak or have fainting episodes.  You develop pain in the shoulder strap areas.  You have chest or leg pain.  You have abnormal vaginal discharge.  You have painful urination. Document Released: 01/11/2004 Document Revised: 05/26/2011 Document Reviewed: 04/10/2007 [facility] Patient Information 2014 [place], [place].

## 2012-10-04 ENCOUNTER — Encounter: Payer: Self-pay | Admitting: Obstetrics and Gynecology

## 2012-10-04 ENCOUNTER — Ambulatory Visit (INDEPENDENT_AMBULATORY_CARE_PROVIDER_SITE_OTHER): Payer: 59 | Admitting: Obstetrics and Gynecology

## 2012-10-04 VITALS — BP 120/64 | Ht 62.0 in | Wt 156.4 lb

## 2012-10-04 DIAGNOSIS — D219 Benign neoplasm of connective and other soft tissue, unspecified: Secondary | ICD-10-CM

## 2012-10-04 NOTE — Patient Instructions (Signed)
Endometrial Biopsy This is a test in which a tissue sample (a biopsy) is taken from inside the uterus (womb). It is then looked at by a specialist under a microscope to see if the tissue is normal or abnormal. The endometrium is the lining of the uterus. This test helps determine where you are in your menstrual cycle and how hormone levels are affecting the lining of the uterus. Another use for this test is to diagnose endometrial cancer, tuberculosis, polyps, or inflammatory conditions and to evaluate uterine bleeding. PREPARATION FOR TEST No preparation or fasting is necessary. NORMAL FINDINGS No pathologic conditions. Presence of "secretory-type" endometrium 3 to 5 days before to normal menstruation. Ranges for normal findings may vary among different laboratories and hospitals. You should always check with your doctor after having lab work or other tests done to discuss the meaning of your test results and whether your values are considered within normal limits. MEANING OF TEST  Your caregiver will go over the test results with you and discuss the importance and meaning of your results, as well as treatment options and the need for additional tests if necessary. OBTAINING THE TEST RESULTS It is your responsibility to obtain your test results. Ask the lab or department performing the test when and how you will get your results. Document Released: 07/04/2004 Document Revised: 05/26/2011 Document Reviewed: 02/11/2008 [employer] Patient Information 2014 [employer].  

## 2012-10-04 NOTE — Progress Notes (Signed)
Patient ID: Victoria Garrison, female   DOB: 1970/09/04, 42 y.o.   MRN: [id number] Pt here today to discuss surgery options for menorrhagia. Discussed options, pt interested in endo ablation. Will proceed with endo biopsy asap, then endo ablation Husb is s/p vasectomy.

## 2012-10-12 ENCOUNTER — Ambulatory Visit (INDEPENDENT_AMBULATORY_CARE_PROVIDER_SITE_OTHER): Payer: 59 | Admitting: Obstetrics and Gynecology

## 2012-10-12 ENCOUNTER — Encounter: Payer: Self-pay | Admitting: Obstetrics and Gynecology

## 2012-10-12 VITALS — BP 122/74 | Ht 62.0 in | Wt 159.0 lb

## 2012-10-12 DIAGNOSIS — Z3202 Encounter for pregnancy test, result negative: Secondary | ICD-10-CM

## 2012-10-12 DIAGNOSIS — N920 Excessive and frequent menstruation with regular cycle: Secondary | ICD-10-CM

## 2012-10-12 LAB — POCT URINE PREGNANCY: Preg Test, Ur: NEGATIVE

## 2012-10-12 NOTE — Patient Instructions (Signed)
Expect a call from Dawn Little to schedule endo ablation 14 August.

## 2012-10-12 NOTE — Progress Notes (Signed)
Endometrial Biopsy: Patient given informed consent, signed copy in the chart, time out was performed. [id number] out taken. . The patient was placed in the lithotomy position and the cervix brought into view with sterile speculum.  Portio of cervix cleansed x 2 with betadine swabs.  A tenaculum was placed in the anterior lip of the cervix. The uterus was sounded for depth of 8 cm,. Milex uterine Explora 3 mm was introduced to into the uterus, suction created,  and an endometrial sample was obtained. All equipment was removed and accounted for.   The patient tolerated the procedure well.    Patient given post procedure instructions. Will be scheduled for endometrial ablation on August 14 Thursday.

## 2012-10-13 ENCOUNTER — Other Ambulatory Visit: Payer: Self-pay | Admitting: Obstetrics and Gynecology

## 2012-10-20 ENCOUNTER — Encounter: Payer: Self-pay | Admitting: Obstetrics and Gynecology

## 2012-10-20 ENCOUNTER — Encounter (HOSPITAL_COMMUNITY): Payer: Self-pay | Admitting: Pharmacy Technician

## 2012-10-21 ENCOUNTER — Telehealth: Payer: Self-pay | Admitting: *Deleted

## 2012-10-21 NOTE — Telephone Encounter (Signed)
Spoke with pt. Pt was questioning why she needed a hysteroscopy D&C along with endo ablation. I explained that those procedures go hand in hand. The hysteroscopy D&C is when they go in and look. D&C is when they clean out the uterus. Endo ablation is scolding the uterus. Pt voiced understanding. JSY

## 2012-10-21 NOTE — Patient Instructions (Addendum)
Victoria Garrison  10/21/2012   Your procedure is scheduled on:  10/28/2012  Report to [facility] at  730 AM.  Call this number if you have problems the morning of surgery: [phone]   Remember:   Do not eat food or drink liquids after midnight.   Take these medicines the morning of surgery with A SIP OF WATER: none   Do not wear jewelry, make-up or nail polish.  Do not wear lotions, powders, or perfumes.  Do not shave 48 hours prior to surgery. Men may shave face and neck.  Do not bring valuables to the hospital.  [facility] is not responsible  for any belongings or valuables.  Contacts, dentures or bridgework may not be worn into surgery.  Leave suitcase in the car. After surgery it may be brought to your room.  For patients admitted to the hospital, checkout time is 11:00 AM the day of discharge.   Patients discharged the day of surgery will not be allowed to drive  home.  Name and phone number of your driver: family  Special Instructions: Shower using CHG 2 nights before surgery and the night before surgery.  If you shower the day of surgery use CHG.  Use special wash - you have one bottle of CHG for all showers.  You should use approximately 1/3 of the bottle for each shower.   Please read over the following fact sheets that you were given: Pain Booklet, Coughing and Deep Breathing, Surgical Site Infection Prevention, Anesthesia Post-op Instructions and Care and Recovery After Surgery Endometrial Ablation Endometrial ablation removes the lining of the uterus (endometrium). It is usually a same day, outpatient treatment. Ablation helps avoid major surgery (such as a hysterectomy). A hysterectomy is removal of the cervix and uterus. Endometrial ablation has less risk and complications, has a shorter recovery period and is less expensive. After endometrial ablation, most women will have little or no menstrual bleeding. You may not keep your fertility. Pregnancy is no longer  likely after this procedure but if you are pre-menopausal, you still need to use a reliable method of birth control following the procedure because pregnancy can occur. REASONS TO HAVE THE PROCEDURE MAY INCLUDE:  Heavy periods.  Bleeding that is causing anemia.  Anovulatory bleeding, very irregular, bleeding.  Bleeding submucous fibroids (on the lining inside the uterus) if they are smaller than 3 centimeters. REASONS NOT TO HAVE THE PROCEDURE MAY INCLUDE:  You wish to have more children.  You have a pre-cancerous or cancerous problem. The cause of any abnormal bleeding must be diagnosed before having the procedure.  You have pain coming from the uterus.  You have a submucus fibroid larger than 3 centimeters.  You recently had a baby.  You recently had an infection in the uterus.  You have a severe retro-flexed, tipped uterus and cannot insert the instrument to do the ablation.  You had a Cesarean section or deep major surgery on the uterus.  The inner cavity of the uterus is too large for the endometrial ablation instrument. RISKS AND COMPLICATIONS   Perforation of the uterus.  Bleeding.  Infection of the uterus, bladder or vagina.  Injury to surrounding organs.  Cutting the cervix.  An air bubble to the lung (air embolus).  Pregnancy following the procedure.  Failure of the procedure to help the problem requiring hysterectomy.  Decreased ability to diagnose cancer in the lining of the uterus. BEFORE THE PROCEDURE  The lining of the uterus must be tested to make sure there is no pre-cancerous or cancer cells present.  Medications may be given to make the lining of the uterus thinner.  Ultrasound may be used to evaluate the size and look for abnormalities of the uterus.  Future pregnancy is not desired. PROCEDURE  There are different ways to destroy the lining of the uterus.   Resectoscope - radio frequency-alternating electric current is the most common  one used.  Cryotherapy - freezing the lining of the uterus.  Heated Free Liquid - heated salt (saline) solution inserted into the uterus.  Microwave - uses high energy microwaves in the uterus.  Thermal Balloon - a catheter with a balloon tip is inserted into the uterus and filled with heated fluid. Your caregiver will talk with you about the method used in this clinic. They will also instruct you on the pros and cons of the procedure. Endometrial ablation is performed along with a procedure called operative hysteroscopy. A narrow viewing tube is inserted through the birth canal (vagina) and through the cervix into the uterus. A tiny camera attached to the viewing tube (hysteroscope) allows the uterine cavity to be shown on a TV monitor during surgery. Your uterus is filled with a harmless liquid to make the procedure easier. The lining of the uterus is then removed. The lining can also be removed with a resectoscope which allows your surgeon to cut away the lining of the uterus under direct vision. Usually, you will be able to go home within an hour after the procedure. HOME CARE INSTRUCTIONS   Do not drive for 24 hours.  No tampons, douching or intercourse for 2 weeks or until your caregiver approves.  Rest at home for 24 to 48 hours. You may then resume normal activities unless told differently by your caregiver.  Take your temperature two times a day for 4 days, and record it.  Take any medications your caregiver has ordered, as directed.  Use some form of contraception if you are pre-menopausal and do not want to get pregnant. Bleeding after the procedure is normal. It varies from light spotting and mildly watery to bloody discharge for 4 to 6 weeks. You may also have mild cramping. Only take over-the-counter or prescription medicines for pain, discomfort, or fever as directed by your caregiver. Do not use aspirin, as this may aggravate bleeding. Frequent urination during the first 24  hours is normal. You will not know how effective your surgery is until at least 3 months after the surgery. SEEK IMMEDIATE MEDICAL CARE IF:   Bleeding is heavier than a normal menstrual cycle.  An oral temperature above 102 F (38.9 C) develops.  You have increasing cramps or pains not relieved with medication or develop belly (abdominal) pain which does not seem to be related to the same area of earlier cramping and pain.  You are light headed, weak or have fainting episodes.  You develop pain in the shoulder strap areas.  You have chest or leg pain.  You have abnormal vaginal discharge.  You have painful urination. Document Released: 01/11/2004 Document Revised: 05/26/2011 Document Reviewed: 04/10/2007 [facility] Patient Information 2014 [place], [place]. Hysteroscopy Hysteroscopy is a procedure used for looking inside the womb (uterus). It may be done for many different reasons, including:  To evaluate abnormal bleeding, fibroid (benign, noncancerous) tumors, polyps, scar tissue (adhesions), and possibly cancer of the uterus.  To look for lumps (tumors) and other uterine growths.  To look for  causes of why a woman cannot get pregnant (infertility), causes of recurrent loss of pregnancy (miscarriages), or a lost intrauterine device (IUD).  To perform a sterilization by blocking the fallopian tubes from inside the uterus. A hysteroscopy should be done right after a menstrual period to be sure you are not pregnant. LET YOUR CAREGIVER KNOW ABOUT:   Allergies.  Medicines taken, including herbs, eyedrops, over-the-counter medicines, and creams.  Use of steroids (by mouth or creams).  Previous problems with anesthetics or numbing medicines.  History of bleeding or blood problems.  History of blood clots.  Possibility of pregnancy, if this applies.  Previous surgery.  Other health problems. RISKS AND COMPLICATIONS   Putting a hole in the uterus.  Excessive  bleeding.  Infection.  Damage to the cervix.  Injury to other organs.  Allergic reaction to medicines.  Too much fluid used in the uterus for the procedure. BEFORE THE PROCEDURE   Do not take aspirin or blood thinners for a week before the procedure, or as directed. It can cause bleeding.  Arrive at least 60 minutes before the procedure or as directed to read and sign the necessary forms.  Arrange for someone to take you home after the procedure.  If you smoke, do not smoke for 2 weeks before the procedure. PROCEDURE   Your caregiver may give you medicine to relax you. He or she may also give you a medicine that numbs the area around the cervix (local anesthetic) or a medicine that makes you sleep (general anesthesia).  Sometimes, a medicine is placed in the cervix the day before the procedure. This medicine makes the cervix have a larger opening (dilate). This makes it easier for the instrument to be inserted into the uterus.  A small instrument (hysteroscope) is inserted through the vagina into the uterus. This instrument is similar to a pencil-sized telescope with a light.  During the procedure, air or a liquid is put into the uterus, which allows the surgeon to see better.  Sometimes, tissue is gently scraped from inside the uterus. These tissue samples are sent to a specialist who looks at tissue samples (pathologist). The pathologist will give a report to your caregiver. This will help your caregiver decide if further treatment is necessary. The report will also help your caregiver decide on the best treatment if the test comes back abnormal. AFTER THE PROCEDURE   If you had a general anesthetic, you may be groggy for a couple hours after the procedure.  If you had a local anesthetic, you will be advised to rest at the surgical center or caregiver's office until you are stable and feel ready to go home.  You may have some cramping for a couple days.  You may have  bleeding, which varies from light spotting for a few days to menstrual-like bleeding for up to 3 to 7 days. This is normal.  Have someone take you home. FINDING OUT THE RESULTS OF YOUR TEST Not all test results are available during your visit. If your test results are not back during the visit, make an appointment with your caregiver to find out the results. Do not assume everything is normal if you have not heard from your caregiver or the medical facility. It is important for you to follow up on all of your test results. HOME CARE INSTRUCTIONS   Do not drive for 24 hours or as instructed.  Only take over-the-counter or prescription medicines for pain, discomfort, or fever as directed by  your caregiver.  Do not take aspirin. It can cause or aggravate bleeding.  Do not drive or drink alcohol while taking pain medicine.  You may resume your usual diet.  Do not use tampons, douche, or have sexual intercourse for 2 weeks, or as advised by your caregiver.  Rest and sleep for the first 24 to 48 hours.  Take your temperature twice a day for 4 to 5 days. Write it down. Give these temperatures to your caregiver if they are abnormal (above 98.6 F or 37.0 C).  Take medicines your caregiver has ordered as directed.  Follow your caregiver's advice regarding diet, exercise, lifting, driving, and general activities.  Take showers instead of baths for 2 weeks, or as recommended by your caregiver.  If you develop constipation:  Take a mild laxative with the advice of your caregiver.  Eat bran foods.  Drink enough water and fluids to keep your urine clear or pale yellow.  Try to have someone with you or available to you for the first 24 to 48 hours, especially if you had a general anesthetic.  Make sure you and your family understand everything about your operation and recovery.  Follow your caregiver's advice regarding follow-up appointments and Pap smears. SEEK MEDICAL CARE IF:   You  feel dizzy or lightheaded.  You feel sick to your stomach (nauseous).  You develop abnormal vaginal discharge.  You develop a rash.  You have an abnormal reaction or allergy to your medicine.  You need stronger pain medicine. SEEK IMMEDIATE MEDICAL CARE IF:   Bleeding is heavier than a normal menstrual period or you have blood clots.  You have an oral temperature above 102 F (38.9 C), not controlled by medicine.  You have increasing cramps or pains not relieved with medicine.  You develop belly (abdominal) pain that does not seem to be related to the same area of earlier cramping and pain.  You pass out.  You develop pain in the tops of your shoulders (shoulder strap areas).  You develop shortness of breath. MAKE SURE YOU:   Understand these instructions.  Will watch your condition.  Will get help right away if you are not doing well or get worse. Document Released: 06/09/2000 Document Revised: 05/26/2011 Document Reviewed: 10/02/2008 [facility] Patient Information 2014 [place], [place]. Dilation and Curettage or Vacuum Curettage Dilation and curettage (D&C) and vacuum curettage are minor procedures. A D&C involves stretching (dilation) the cervix and scraping (curettage) the inside lining of the womb (uterus). During a D&C, tissue is gently scraped from the inside lining of the uterus. During a vacuum curettage, the lining and tissue in the uterus are removed with the use of gentle suction. Curettage may be performed for diagnostic or therapeutic purposes. As a diagnostic procedure, curettage is performed for the purpose of examining tissues from the uterus. Tissue examination may help determine causes or treatment options for symptoms. A diagnostic curettage may be performed for the following symptoms:  Irregular bleeding in the uterus.  Bleeding with the development of clots.  Spotting between menstrual periods.  Prolonged menstrual periods.  Bleeding after  menopause.  No menstrual period (amenorrhea).  A change in size and shape of the uterus. A therapeutic curettage is performed to remove tissue, blood, or a contraceptive device. Therapeutic curettage may be performed for the following conditions:   Removal of an IUD (intrauterine device).  Removal of retained placenta after giving birth. Retained placenta can cause bleeding severe enough to require transfusions or an infection.  Abortion.  Miscarriage.  Removal of polyps inside the uterus.  Removal of uncommon types of fibroids (noncancerous lumps). LET YOUR CAREGIVER KNOW ABOUT:   Allergies to food or medicine.  Medicines taken, including vitamins, herbs, eyedrops, over-the-counter medicines, and creams.  Use of steroids (by mouth or creams).  Previous problems with anesthetics or numbing medicines.  History of bleeding problems or blood clots.  Previous surgery.  Other health problems, including diabetes and kidney problems.  Possibility of pregnancy, if this applies. RISKS AND COMPLICATIONS   Excessive bleeding.  Infection of the uterus.  Damage to the cervix.  Development of scar tissue (adhesions) inside the uterus, later causing abnormal amounts of menstrual bleeding.  Complications from the general anesthetic, if a general anesthetic is used.  Putting a hole (perforation) in the uterus. This is rare. BEFORE THE PROCEDURE   Eat and drink before the procedure only as directed by your caregiver.  Arrange for someone to take you home. PROCEDURE   This procedure may be done in a hospital, outpatient clinic, or caregiver's office.  You may be given a general anesthetic or a local anesthetic in and around the cervix.  You will lie on your back with your legs in stirrups.  There are two ways in which your cervix can be softened and dilated. These include:  Taking a medicine.  Having thin rods (laminaria) inserted into your cervix.  A curved tool  (curette) will scrape cells from the inside lining of the uterus and will then be removed. This procedure usually takes about 15 to 30 minutes. AFTER THE PROCEDURE   You will rest in the recovery area until you are stable and are ready to go home.  You will need to have someone take you home.  You may feel sick to your stomach (nauseous) or throw up (vomit) if you had general anesthesia.  You may have a sore throat if a tube was placed in your throat during general anesthesia.  You may have light cramping and bleeding for 2 days to 2 weeks after the procedure.  Your uterus needs to make a new lining after the procedure. This may make your next period late. Document Released: 03/03/2005 Document Revised: 05/26/2011 Document Reviewed: 09/29/2008 [facility] Patient Information 2014 [place], [place]. PATIENT INSTRUCTIONS POST-ANESTHESIA  IMMEDIATELY FOLLOWING SURGERY:  Do not drive or operate machinery for the first twenty four hours after surgery.  Do not make any important decisions for twenty four hours after surgery or while taking narcotic pain medications or sedatives.  If you develop intractable nausea and vomiting or a severe headache please notify your doctor immediately.  FOLLOW-UP:  Please make an appointment with your surgeon as instructed. You do not need to follow up with anesthesia unless specifically instructed to do so.  WOUND CARE INSTRUCTIONS (if applicable):  Keep a dry clean dressing on the anesthesia/puncture wound site if there is drainage.  Once the wound has quit draining you may leave it open to air.  Generally you should leave the bandage intact for twenty four hours unless there is drainage.  If the epidural site drains for more than 36-48 hours please call the anesthesia department.  QUESTIONS?:  Please feel free to call your physician or the hospital operator if you have any questions, and they will be happy to assist you.

## 2012-10-22 ENCOUNTER — Encounter (HOSPITAL_COMMUNITY): Payer: Self-pay

## 2012-10-22 ENCOUNTER — Encounter (HOSPITAL_COMMUNITY)
Admission: RE | Admit: 2012-10-22 | Discharge: 2012-10-22 | Disposition: A | Discharge status: Home / Self Care | Payer: 59 | Source: Ambulatory Visit | Attending: Obstetrics and Gynecology | Admitting: Obstetrics and Gynecology

## 2012-10-22 ENCOUNTER — Other Ambulatory Visit: Payer: Self-pay | Admitting: Obstetrics and Gynecology

## 2012-10-22 DIAGNOSIS — N920 Excessive and frequent menstruation with regular cycle: Secondary | ICD-10-CM | POA: Insufficient documentation

## 2012-10-22 DIAGNOSIS — Z01812 Encounter for preprocedural laboratory examination: Secondary | ICD-10-CM | POA: Insufficient documentation

## 2012-10-22 HISTORY — DX: Anemia, unspecified: D64.9

## 2012-10-22 LAB — URINALYSIS, ROUTINE W REFLEX MICROSCOPIC
Leukocytes, UA: NEGATIVE
Nitrite: NEGATIVE
Protein, ur: NEGATIVE mg/dL
Specific Gravity, Urine: >1.03 — ABNORMAL HIGH (ref 1.005–1.030)
Urobilinogen, UA: 0.2 mg/dL (ref 0.0–1.0)

## 2012-10-22 LAB — CBC
Platelets: 371 10*3/uL (ref 150–400)
RBC: 4.54 MIL/uL (ref 3.87–5.11)
RDW: 13.9 % (ref 11.5–15.5)
WBC: 6.5 10*3/uL (ref 4.0–10.5)

## 2012-10-22 LAB — URINE MICROSCOPIC-ADD ON

## 2012-10-25 ENCOUNTER — Encounter: Payer: Self-pay | Admitting: Obstetrics and Gynecology

## 2012-10-25 ENCOUNTER — Telehealth: Payer: Self-pay | Admitting: *Deleted

## 2012-10-25 NOTE — Telephone Encounter (Signed)
If pt has not begun menses by tomorrow, will postpone D&C until at least day 3 of next menses.  Pt to call here tomorrow to postpone.

## 2012-10-25 NOTE — Telephone Encounter (Signed)
Pt is scheduled for surgery on 10/28/12 but remembered Dr. Emelda Fear saying it's better to have done after a period. She hasn't started yet this month. What do you advise?

## 2012-10-27 ENCOUNTER — Encounter: Payer: Self-pay | Admitting: Obstetrics and Gynecology

## 2012-11-04 ENCOUNTER — Ambulatory Visit (HOSPITAL_COMMUNITY): Payer: 59 | Admitting: Anesthesiology

## 2012-11-04 ENCOUNTER — Ambulatory Visit (HOSPITAL_COMMUNITY)
Admission: RE | Admit: 2012-11-04 | Discharge: 2012-11-04 | Disposition: A | Discharge status: Home / Self Care | Payer: 59 | Source: Ambulatory Visit | Attending: Obstetrics and Gynecology | Admitting: Obstetrics and Gynecology

## 2012-11-04 ENCOUNTER — Encounter (HOSPITAL_COMMUNITY): Payer: Self-pay | Admitting: *Deleted

## 2012-11-04 ENCOUNTER — Encounter (HOSPITAL_COMMUNITY): Payer: Self-pay | Admitting: Anesthesiology

## 2012-11-04 ENCOUNTER — Encounter (HOSPITAL_COMMUNITY)
Admission: RE | Disposition: A | Discharge status: Home / Self Care | Payer: Self-pay | Source: Ambulatory Visit | Attending: Obstetrics and Gynecology

## 2012-11-04 DIAGNOSIS — Z8249 Family history of ischemic heart disease and other diseases of the circulatory system: Secondary | ICD-10-CM | POA: Insufficient documentation

## 2012-11-04 DIAGNOSIS — B009 Herpesviral infection, unspecified: Secondary | ICD-10-CM | POA: Insufficient documentation

## 2012-11-04 DIAGNOSIS — Z803 Family history of malignant neoplasm of breast: Secondary | ICD-10-CM | POA: Insufficient documentation

## 2012-11-04 DIAGNOSIS — N921 Excessive and frequent menstruation with irregular cycle: Secondary | ICD-10-CM

## 2012-11-04 DIAGNOSIS — Z808 Family history of malignant neoplasm of other organs or systems: Secondary | ICD-10-CM | POA: Insufficient documentation

## 2012-11-04 DIAGNOSIS — N840 Polyp of corpus uteri: Secondary | ICD-10-CM | POA: Insufficient documentation

## 2012-11-04 DIAGNOSIS — N946 Dysmenorrhea, unspecified: Secondary | ICD-10-CM | POA: Insufficient documentation

## 2012-11-04 DIAGNOSIS — D649 Anemia, unspecified: Secondary | ICD-10-CM | POA: Insufficient documentation

## 2012-11-04 DIAGNOSIS — N920 Excessive and frequent menstruation with regular cycle: Secondary | ICD-10-CM | POA: Insufficient documentation

## 2012-11-04 DIAGNOSIS — D249 Benign neoplasm of unspecified breast: Secondary | ICD-10-CM | POA: Insufficient documentation

## 2012-11-04 DIAGNOSIS — D259 Leiomyoma of uterus, unspecified: Secondary | ICD-10-CM | POA: Insufficient documentation

## 2012-11-04 DIAGNOSIS — Z888 Allergy status to other drugs, medicaments and biological substances status: Secondary | ICD-10-CM | POA: Insufficient documentation

## 2012-11-04 HISTORY — PX: DILITATION & CURRETTAGE/HYSTROSCOPY WITH THERMACHOICE ABLATION: SHX5569

## 2012-11-04 SURGERY — DILATATION & CURETTAGE/HYSTEROSCOPY WITH THERMACHOICE ABLATION
Anesthesia: General | Wound class: Clean Contaminated

## 2012-11-04 MED ORDER — CEFAZOLIN SODIUM-DEXTROSE 2-3 GM-% IV SOLR
INTRAVENOUS | Status: AC
  Filled 2012-11-04: qty 50

## 2012-11-04 MED ORDER — MIDAZOLAM HCL 2 MG/2ML IJ SOLN
1.0000 mg | INTRAMUSCULAR | Status: DC | PRN
  Administered 2012-11-04 (×2): 2 mg via INTRAVENOUS

## 2012-11-04 MED ORDER — FENTANYL CITRATE 0.05 MG/ML IJ SOLN
25.0000 ug | INTRAMUSCULAR | Status: AC
  Administered 2012-11-04 (×2): 25 ug via INTRAVENOUS

## 2012-11-04 MED ORDER — ARTIFICIAL TEARS OP OINT
TOPICAL_OINTMENT | OPHTHALMIC | Status: AC
  Filled 2012-11-04: qty 7

## 2012-11-04 MED ORDER — METOPROLOL TARTRATE 1 MG/ML IV SOLN
INTRAVENOUS | Status: AC
  Filled 2012-11-04: qty 5

## 2012-11-04 MED ORDER — FENTANYL CITRATE 0.05 MG/ML IJ SOLN
INTRAMUSCULAR | Status: AC
  Filled 2012-11-04: qty 2

## 2012-11-04 MED ORDER — CEFAZOLIN SODIUM-DEXTROSE 2-3 GM-% IV SOLR
2.0000 g | INTRAVENOUS | Status: AC
  Administered 2012-11-04: 2 g via INTRAVENOUS

## 2012-11-04 MED ORDER — BUPIVACAINE-EPINEPHRINE PF 0.5-1:200000 % IJ SOLN
INTRAMUSCULAR | Status: AC
  Filled 2012-11-04: qty 10

## 2012-11-04 MED ORDER — FENTANYL CITRATE 0.05 MG/ML IJ SOLN
INTRAMUSCULAR | Status: DC | PRN
  Administered 2012-11-04: 50 ug via INTRAVENOUS
  Administered 2012-11-04: 25 ug via INTRAVENOUS

## 2012-11-04 MED ORDER — OXYCODONE-ACETAMINOPHEN 5-325 MG PO TABS: 1.0000 | ORAL_TABLET | ORAL | Status: DC | PRN

## 2012-11-04 MED ORDER — SODIUM CHLORIDE 0.9 % IR SOLN
Status: DC | PRN
  Administered 2012-11-04: 3000 mL

## 2012-11-04 MED ORDER — PROPOFOL 10 MG/ML IV BOLUS
INTRAVENOUS | Status: DC | PRN
  Administered 2012-11-04: 200 mg via INTRAVENOUS

## 2012-11-04 MED ORDER — 0.9 % SODIUM CHLORIDE (POUR BTL) OPTIME
TOPICAL | Status: DC | PRN
  Administered 2012-11-04: 1000 mL

## 2012-11-04 MED ORDER — KETOROLAC TROMETHAMINE 10 MG PO TABS: 10.0000 mg | ORAL_TABLET | Freq: Four times a day (QID) | ORAL | Status: DC | PRN

## 2012-11-04 MED ORDER — MIDAZOLAM HCL 2 MG/2ML IJ SOLN
INTRAMUSCULAR | Status: AC
  Filled 2012-11-04: qty 2

## 2012-11-04 MED ORDER — LACTATED RINGERS IV SOLN
INTRAVENOUS | Status: DC
  Administered 2012-11-04: 10:00:00 via INTRAVENOUS

## 2012-11-04 MED ORDER — LIDOCAINE HCL (PF) 1 % IJ SOLN
INTRAMUSCULAR | Status: AC
  Filled 2012-11-04: qty 5

## 2012-11-04 MED ORDER — ONDANSETRON HCL 4 MG/2ML IJ SOLN
INTRAMUSCULAR | Status: AC
  Filled 2012-11-04: qty 2

## 2012-11-04 MED ORDER — METOPROLOL TARTRATE 1 MG/ML IV SOLN
5.0000 mg | Freq: Once | INTRAVENOUS | Status: AC
  Administered 2012-11-04: 5 mg via INTRAVENOUS

## 2012-11-04 MED ORDER — PROPOFOL 10 MG/ML IV EMUL
INTRAVENOUS | Status: AC
  Filled 2012-11-04: qty 20

## 2012-11-04 MED ORDER — LIDOCAINE HCL (CARDIAC) 20 MG/ML IV SOLN
INTRAVENOUS | Status: DC | PRN
  Administered 2012-11-04: 50 mg via INTRAVENOUS

## 2012-11-04 MED ORDER — LACTATED RINGERS IV SOLN
INTRAVENOUS | Status: DC | PRN
  Administered 2012-11-04 (×2): via INTRAVENOUS

## 2012-11-04 MED ORDER — ONDANSETRON HCL 4 MG/2ML IJ SOLN
4.0000 mg | Freq: Once | INTRAMUSCULAR | Status: AC
  Administered 2012-11-04: 4 mg via INTRAVENOUS

## 2012-11-04 MED ORDER — DEXTROSE 5 % IV SOLN
INTRAVENOUS | Status: DC | PRN
  Administered 2012-11-04: 14 mL

## 2012-11-04 MED ORDER — FENTANYL CITRATE 0.05 MG/ML IJ SOLN
INTRAMUSCULAR | Status: AC
  Filled 2012-11-04: qty 5

## 2012-11-04 MED ORDER — FENTANYL CITRATE 0.05 MG/ML IJ SOLN
25.0000 ug | INTRAMUSCULAR | Status: DC | PRN
Start: 2012-11-04 — End: 2012-11-04
  Administered 2012-11-04: 25 ug via INTRAVENOUS
  Administered 2012-11-04: 50 ug via INTRAVENOUS
  Administered 2012-11-04: 25 ug via INTRAVENOUS

## 2012-11-04 MED ORDER — ONDANSETRON HCL 4 MG/2ML IJ SOLN: 4.0000 mg | Freq: Once | INTRAMUSCULAR | Status: DC | PRN

## 2012-11-04 MED ORDER — SUCCINYLCHOLINE CHLORIDE 20 MG/ML IJ SOLN
INTRAMUSCULAR | Status: AC
  Filled 2012-11-04: qty 1

## 2012-11-04 MED ORDER — BUPIVACAINE-EPINEPHRINE 0.5% -1:200000 IJ SOLN
INTRAMUSCULAR | Status: DC | PRN
  Administered 2012-11-04: 20 mL

## 2012-11-04 SURGICAL SUPPLY — 29 items
BAG DECANTER FOR FLEXI CONT (MISCELLANEOUS) ×2 IMPLANT
BAG HAMPER (MISCELLANEOUS) ×2 IMPLANT
CATH THERMACHOICE III (CATHETERS) ×2 IMPLANT
CLOTH BEACON ORANGE TIMEOUT ST (SAFETY) ×2 IMPLANT
COVER LIGHT HANDLE STERIS (MISCELLANEOUS) ×4 IMPLANT
COVER MAYO STAND XLG (DRAPE) ×1 IMPLANT
DECANTER SPIKE VIAL GLASS SM (MISCELLANEOUS) ×1 IMPLANT
FORMALIN 10 PREFIL 120ML (MISCELLANEOUS) ×2 IMPLANT
GLOVE BIOGEL PI IND STRL 7.0 (GLOVE) IMPLANT
GLOVE BIOGEL PI INDICATOR 7.0 (GLOVE) ×1
GLOVE ECLIPSE 9.0 STRL (GLOVE) ×2 IMPLANT
GLOVE EXAM NITRILE MD LF STRL (GLOVE) ×1 IMPLANT
GLOVE INDICATOR STER SZ 9 (GLOVE) ×2 IMPLANT
GLOVE SS BIOGEL STRL SZ 6.5 (GLOVE) IMPLANT
GLOVE SUPERSENSE BIOGEL SZ 6.5 (GLOVE) ×1
GOWN STRL REIN 3XL LVL4 (GOWN DISPOSABLE) ×2 IMPLANT
GOWN STRL REIN XL XLG (GOWN DISPOSABLE) ×3 IMPLANT
INST SET HYSTEROSCOPY (KITS) ×2 IMPLANT
IV D5W 500ML (IV SOLUTION) ×2 IMPLANT
IV NS IRRIG 3000ML ARTHROMATIC (IV SOLUTION) ×2 IMPLANT
KIT ROOM TURNOVER AP CYSTO (KITS) ×2 IMPLANT
MANIFOLD NEPTUNE II (INSTRUMENTS) ×2 IMPLANT
NS IRRIG 1000ML POUR BTL (IV SOLUTION) ×2 IMPLANT
PACK PERI GYN (CUSTOM PROCEDURE TRAY) ×2 IMPLANT
PAD ARMBOARD 7.5X6 YLW CONV (MISCELLANEOUS) ×2 IMPLANT
PAD TELFA 3X4 1S STER (GAUZE/BANDAGES/DRESSINGS) ×2 IMPLANT
SET BASIN LINEN APH (SET/KITS/TRAYS/PACK) ×2 IMPLANT
SET IRRIG Y TYPE TUR BLADDER L (SET/KITS/TRAYS/PACK) ×2 IMPLANT
SYR CONTROL 10ML LL (SYRINGE) ×2 IMPLANT

## 2012-11-04 NOTE — Anesthesia Postprocedure Evaluation (Signed)
  Anesthesia Post-op Note  Patient: Victoria Garrison  Procedure(s) Performed: Procedure(s): HYSTEROSCOPY; DILATATION AND CURETTAGE;THERMACHOICE ENDOMETRIAL ABLATION (Total Therapy Time=9 min 18sec) (N/A)  Patient Location: PACU  Anesthesia Type:General  Level of Consciousness: awake, alert , oriented and patient cooperative  Airway and Oxygen Therapy: Patient Spontanous Breathing and Patient connected to face mask oxygen  Post-op Pain: none  Post-op Assessment: Post-op Vital signs reviewed, Patient's Cardiovascular Status Stable, Respiratory Function Stable, Patent Airway, No signs of Nausea or vomiting and Pain level controlled  Post-op Vital Signs: Reviewed and stable  Complications: No apparent anesthesia complications

## 2012-11-04 NOTE — Anesthesia Procedure Notes (Signed)
Procedure Name: LMA Insertion Date/Time: 11/04/2012 10:50 AM Performed by: Carolyne Littles, Shantavia Jha L Pre-anesthesia Checklist: Patient identified, Timeout performed, Emergency Drugs available, Suction available and Patient being monitored Patient Re-evaluated:Patient Re-evaluated prior to inductionOxygen Delivery Method: Circle system utilized Preoxygenation: Pre-oxygenation with 100% oxygen Intubation Type: IV induction Ventilation: Mask ventilation without difficulty LMA: LMA inserted LMA Size: 4.0 Number of attempts: 1 Placement Confirmation: positive ETCO2 and breath sounds checked- equal and bilateral Tube secured with: Tape Dental Injury: Teeth and Oropharynx as per pre-operative assessment

## 2012-11-04 NOTE — Brief Op Note (Signed)
11/04/2012  11:47 AM  PATIENT:  Victoria Garrison  42 y.o. female  PRE-OPERATIVE DIAGNOSIS:  menometorrhagia  POST-OPERATIVE DIAGNOSIS:  menometorrhagia; endometrial polyp  PROCEDURE:  Procedure(s): HYSTEROSCOPY; DILATATION AND CURETTAGE;THERMACHOICE ENDOMETRIAL ABLATION (Total Therapy Time=9 min 18sec) (N/A)  SURGEON:  Surgeon(s) and Role:    * Tilda Burrow, MD - Primary  PHYSICIAN ASSISTANT:   ASSISTANTS: none   ANESTHESIA:   local and general  EBL:  Total I/O In: 1300 [I.V.:1300] Out: -   BLOOD ADMINISTERED:none  DRAINS: none   LOCAL MEDICATIONS USED:  MARCAINE    and Amount: 20 ml  SPECIMEN:  Source of Specimen:  endometrial curettings,polyp  DISPOSITION OF SPECIMEN:  PATHOLOGY  COUNTS:  YES  TOURNIQUET:  * No tourniquets in log *  DICTATION: .Dragon Dictation  PLAN OF CARE: Discharge to home after PACU  PATIENT DISPOSITION:  PACU - hemodynamically stable.   Delay start of Pharmacological VTE agent (>24hrs) due to surgical blood loss or risk of bleeding: not applicable

## 2012-11-04 NOTE — Transfer of Care (Signed)
Immediate Anesthesia Transfer of Care Note  Patient: Victoria Garrison  Procedure(s) Performed: Procedure(s): HYSTEROSCOPY; DILATATION AND CURETTAGE;THERMACHOICE ENDOMETRIAL ABLATION (Total Therapy Time=9 min 18sec) (N/A)  Patient Location: PACU  Anesthesia Type:General  Level of Consciousness: awake, alert , oriented and patient cooperative  Airway & Oxygen Therapy: Patient Spontanous Breathing and Patient connected to face mask oxygen  Post-op Assessment: Report given to PACU RN and Post -op Vital signs reviewed and stable  Post vital signs: Reviewed and stable  Complications: No apparent anesthesia complications

## 2012-11-04 NOTE — Op Note (Signed)
PATIENT:  Victoria Garrison  42 y.o. female  PRE-OPERATIVE DIAGNOSIS:  menometorrhagia  POST-OPERATIVE DIAGNOSIS:  menometorrhagia; endometrial polyp  PROCEDURE:  Procedure(s): HYSTEROSCOPY; DILATATION AND CURETTAGE;THERMACHOICE ENDOMETRIAL ABLATION (Total Therapy Time=9 min 18sec) (N/A)  SURGEON:  Surgeon(s) and Role:    * Tilda Burrow, MD - Primary  Details of procedure: Patient was taken to the operating room prepped and draped for vaginal procedure legs in low lithotomy like support. Timeout was conducted by surgical team and procedure confirmed. Ancef was administered 2 g intravenously. Speculum was inserted cervix grasped with single-tooth tenaculum paracervical block applied using 20 cc of Marcaine with epinephrine, then the uterus sounded to 9 cm dilated to 23 [place], then hysteroscopy performed with a rigid 30 hysteroscope identifying a large polyp in the uterine fundus which was snipped office-based easily and then extracted using Randall stone forceps. Smooth sharp curettage of all uterine surfaces was performed then the Gynecare ThermaChoice 3 endometrial ablation device repaired inserted insufflated with 14 cc of D5W and the 8 minute thermal ablation sequence completed without difficulty followed by removal of all 14 cc of fluid, and then procedure considered satisfactorily completed patient tolerated the procedure well condition to recovery room stable

## 2012-11-04 NOTE — H&P (Signed)
Victoria Garrison is an 42 y.o. female. She is admitted for hysteroscopy, dilation and curettage, endometrial ablation.LMP was the 13th. No sex since then  Pertinent Gynecological History: Menses: flow is excessive with use of both pads or tampons on heaviest days Bleeding: heavy, unacceptable to pt Contraception: tubal ligation DES exposure: unknown Blood transfusions: none Sexually transmitted diseases: no past history Previous GYN Procedures:   Last mammogram:  Date:  Last pap: normal Date: 2014 OB History: G, P2   Menstrual History: Menarche age:  Patient's last menstrual period was 10/27/2012.    Past Medical History  Diagnosis Date  . Abnormal Pap smear   . HSV-1 (herpes simplex virus 1) infection   . Menorrhagia   . Dysmenorrhea   . Fibroadenoma of left breast   . Fibroids 09/02/2012  . Anemia     Due to heavy menustral cycles    Past Surgical History  Procedure Laterality Date  . Cholecystectomy  2014  . Vaginal delivery      3  . Breast fibroadenoma surgery      Family History  Problem Relation Age of Onset  . Diabetes Mother   . Hypertension Mother   . Cancer Father     basal cell skin  . Cancer Maternal Aunt     breast  . CAD Maternal Aunt   . CAD Paternal Grandmother   . Cancer Paternal Grandmother     breast    Social History:  reports that she has never smoked. She has never used smokeless tobacco. She reports that  drinks alcohol. She reports that she does not use illicit drugs.  Allergies:  Allergies  Allergen Reactions  . Guaifenesin     Hives     No prescriptions prior to admission    ROS  Blood pressure 100/62, pulse 95, temperature 98.6 F (37 C), temperature source Oral, resp. rate 24, height 5\' 2"  (1.575 m), weight 155 lb (70.308 kg), last menstrual period 10/27/2012, SpO2 96.00%. Physical Exam Physical Examination: General appearance - alert, well appearing, and in no distress, oriented to person, place, and time and normal  appearing weight Mental status - alert, oriented to person, place, and time, normal mood, behavior, speech, dress, motor activity, and thought processes Eyes - pupils equal and reactive, extraocular eye movements intact Neck - supple, no significant adenopathy Chest - clear to auscultation, no wheezes, rales or rhonchi, symmetric air entry Heart - normal rate and regular rhythm Abdomen - soft, nontender, nondistended, no masses or organomegaly Pelvic - normal external genitalia, vulva, vagina, cervix, uterus and adnexa, UTERUS: uterus is normal size, shape, consistency and nontender, retroverted   No results found for this or any previous visit (from the past 24 hour(s)).  No results found.  Assessment/Plan: Menorrhagia, for endometrial ablation  Victoria Garrison V 11/04/2012, 10:29 AM

## 2012-11-04 NOTE — Preoperative (Signed)
Beta Blockers   Reason not to administer Beta Blockers:Not Applicable 

## 2012-11-04 NOTE — Anesthesia Preprocedure Evaluation (Addendum)
Anesthesia Evaluation  Patient identified by MRN, date of birth, ID band Patient awake    Reviewed: Allergy & Precautions, H&P , NPO status , Patient's Chart, lab work & pertinent test results  History of Anesthesia Complications Negative for: history of anesthetic complications  Airway Mallampati: I TM Distance: >3 FB Neck ROM: Full    Dental  (+) Teeth Intact   Pulmonary neg pulmonary ROS,  breath sounds clear to auscultation        Cardiovascular negative cardio ROS  Rhythm:Regular Rate:Normal     Neuro/Psych    GI/Hepatic negative GI ROS,   Endo/Other    Renal/GU      Musculoskeletal   Abdominal   Peds  Hematology   Anesthesia Other Findings   Reproductive/Obstetrics                           Anesthesia Physical Anesthesia Plan  ASA: I  Anesthesia Plan: General   Post-op Pain Management:    Induction: Intravenous  Airway Management Planned: LMA  Additional Equipment:   Intra-op Plan:   Post-operative Plan: Extubation in OR  Informed Consent: I have reviewed the patients History and Physical, chart, labs and discussed the procedure including the risks, benefits and alternatives for the proposed anesthesia with the patient or authorized representative who has indicated his/her understanding and acceptance.     Plan Discussed with:   Anesthesia Plan Comments:         Anesthesia Quick Evaluation

## 2012-11-05 ENCOUNTER — Encounter: Payer: 59 | Admitting: Obstetrics and Gynecology

## 2012-11-05 ENCOUNTER — Encounter (HOSPITAL_COMMUNITY): Payer: Self-pay | Admitting: Obstetrics and Gynecology

## 2012-11-12 ENCOUNTER — Ambulatory Visit (INDEPENDENT_AMBULATORY_CARE_PROVIDER_SITE_OTHER): Payer: 59 | Admitting: Obstetrics and Gynecology

## 2012-11-12 ENCOUNTER — Encounter: Payer: Self-pay | Admitting: Obstetrics and Gynecology

## 2012-11-12 VITALS — BP 110/70 | Ht 62.0 in | Wt 156.8 lb

## 2012-11-12 DIAGNOSIS — Z9889 Other specified postprocedural states: Secondary | ICD-10-CM

## 2012-11-12 DIAGNOSIS — D219 Benign neoplasm of connective and other soft tissue, unspecified: Secondary | ICD-10-CM

## 2012-11-12 NOTE — Assessment & Plan Note (Signed)
Endometrial ablation for heavy menses and endometrial polyp

## 2012-11-12 NOTE — Patient Instructions (Addendum)
1 lb = 3500 stored calories ALL YOU NEED TOKNOW  :)

## 2012-11-12 NOTE — Progress Notes (Signed)
Patient ID: Victoria Garrison, female   DOB: 10-02-1970, 42 y.o.   MRN: [id number] Pt here today for post op visit. Pt denies any problems or concerns at this time. Pt had D&C and ablation 1 week ago   Assessment:  Post-Op status post operative hysteroscopy for abnormal uterine bleeding.1 weeks ago.  Doing well postoperatively.   Plan:  1. Continue any current medications. 2. Wound care discussed. 3. Activity restrictions: no restriciton 4. Anticipated return to work: not applicable. 5. Follow up in prn weeks.  Subjective:  Victoria Garrison is a 42 y.o. female who presents to the clinic 1 weeks status post operative hysteroscopy and endo ablation.  polyp was benign.  She has been eating a regular diet without difficulty.  Bowel movements are normal. The patient is not having any pain.  Review of Systems Negative except as per HPI   Objective:  BP 110/70  Ht 5\' 2"  (1.575 m)  Wt 156 lb 12.8 oz (71.124 kg)  BMI 28.67 kg/m2  LMP 10/27/2012 General:Well developed, well nourished.  No acute distress. Abdomen: Bowel sounds normal, soft, non-tender. Pelvic Exam: External Genitalia:  Normal. Vagina: Normal Bimanual: Normal Cervix: lte discharge Uterus: minimal tender Adnexa: Normal Incision(s):Healing well, no drainage, no erythema, no hernia, no swelling, no dehiscence, incision well approximated.

## 2013-01-20 ENCOUNTER — Other Ambulatory Visit: Payer: Self-pay

## 2013-01-24 ENCOUNTER — Encounter: Payer: Self-pay | Admitting: Dietician

## 2013-01-24 ENCOUNTER — Encounter: Payer: 59 | Attending: Internal Medicine | Admitting: Dietician

## 2013-01-24 VITALS — Ht 62.0 in | Wt 159.8 lb

## 2013-01-24 DIAGNOSIS — E663 Overweight: Secondary | ICD-10-CM | POA: Insufficient documentation

## 2013-01-24 DIAGNOSIS — Z713 Dietary counseling and surveillance: Secondary | ICD-10-CM | POA: Insufficient documentation

## 2013-01-24 NOTE — Progress Notes (Signed)
  Medical Nutrition Therapy:  Appt start time: 1130 end time:  1230.  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 States that she has tried following a low-calorie and low-carb diet in the past in for weight loss but was not able to maintain weight she lost from past diets. Weight has fluctuated from 155-159 lbs for the past several years.  States that she lives with her husband, son, and daughter and splits the food shopping and preparation with her daughter. Goes out to lunch most days. Feels like she doesn't have time to eat breakfast most day and states that she feels more hungry on the days that she does eat breakfast.   Preferred Learning Style:   No preference indicated   Learning Readiness:   Ready  MEDICATIONS: none   DIETARY INTAKE:  Avoided foods include green peas  24-hr recall:  B ( AM): peanut butter cracker or protein bar, skips 4 x weeks with coffee with two sugars and two creamers  Snk ( AM): none  L ( PM): chicken and salad with baked potato with sweet tea Snk ( PM): none D ( PM): meat (hamburger, steak, chicken) with vegetables, macaroni and cheese or potatoes with sweet tea Snk ( PM): potato chips or mini chocolate bar, usually nothing Beverages: water in between meals, 24-32 oz of sweet tea per day  Usual physical activity: walking 30 min+ 3-4 x week with daughter  Estimated energy needs: 1800 calories 200 g carbohydrates 135 g protein 50 g fat  Progress Towards Goal(s):  In progress.   Nutritional Diagnosis:  [place]-3.3 Overweight/obesity As related to hx of energy dense food choices .  As evidenced by BMI of 29.2.    Intervention:  Nutrition counseling provided.  Plan: Eat 3 meals and 2-3 snacks per day if needed.  For breakfast, try yogurt, peanut butter toast (or sandwich), egg sandwich on English muffin, or smoothie (look for less than 300 calories, more than 15 grams of protein, and less than 50  g carbohydrates). Have snacks available at home and work with protein such as nuts, peanut butter, string cheese, fruits, chicken or tuna salad, or crackers.  Consider switching to unsweet tea with no-calorie sweetener. Fill half of plate with vegetables at lunch and dinner.  Aim to get 30 minutes of exercise 5 x week.   Teaching Method Utilized:  Visual Auditory  Handouts given during visit include:  MyPlate Handout  15 g CHO Snacks  Barriers to learning/adherence to lifestyle change: has trouble fitting breakfast into schedule  Demonstrated degree of understanding via:  Teach Back   Monitoring/Evaluation:  Dietary intake, exercise, and body weight prn.

## 2013-01-24 NOTE — Patient Instructions (Signed)
Eat 3 meals and 2-3 snacks per day if needed.  For breakfast, try yogurt, peanut butter toast (or sandwich), egg sandwich on English muffin, or smoothie (look for less than 300 calories, more than 15 grams of protein, and less than 50 g carbohydrates). Have snacks available at home and work with protein such as nuts, peanut butter, string cheese, fruits, chicken or tuna salad, or crackers.  Consider switching to unsweet tea with no-calorie sweetener. Fill half of plate with vegetables at lunch and dinner.  Aim to get 30 minutes of exercise 5 x week.

## 2013-04-01 ENCOUNTER — Encounter: Payer: Self-pay | Admitting: Adult Health

## 2013-04-01 ENCOUNTER — Ambulatory Visit (INDEPENDENT_AMBULATORY_CARE_PROVIDER_SITE_OTHER): Payer: 59 | Admitting: Adult Health

## 2013-04-01 VITALS — HR 76 | Resp 14 | Ht 62.0 in | Wt 160.0 lb

## 2013-04-01 DIAGNOSIS — Z713 Dietary counseling and surveillance: Secondary | ICD-10-CM | POA: Insufficient documentation

## 2013-04-01 MED ORDER — PHENTERMINE HCL 37.5 MG PO TABS: 37.5000 mg | ORAL_TABLET | Freq: Every day | ORAL | Status: DC

## 2013-04-01 NOTE — Progress Notes (Signed)
   Subjective:    Patient ID: Victoria Garrison, female    DOB: 06/11/1970, 43 y.o.   MRN: [id number]  HPI  Victoria Garrison is a pleasant 43 y/o female who presents to clinic with concerns about gaining weight. She was seeing a dietician and eating healthy. She was motivated and then the holidays happened. She is discouraged, but also ready to do something about losing weight.  For her meals she describes the following:  Breakfast - granola bars or a piece of fruit. If pressed for time she finds herself going for the first thing she can get such as lance peanut butter crackers. She reports usually not hungry at breakfast.   Lunch - she eats out for lunch - chicken nuggets from [employer] or a salad from [place] or [employer]. Most meals are fast food.  Dinner - she normally has chicken and vegetables or hamburger/hotdogs. She prepares these meals or her daughter prepares them.  Exercise - She was walking during lunch time but with the weather being so cold she has not been doing this. She was also doing some stretching and other stationery exercise but has not been doing this lately.  She does not snack a lot.  She feels she is not eating enough. Her weakness is sweet tea.    Review of Systems  Constitutional: Positive for fatigue.  Respiratory: Negative.   Cardiovascular: Negative.   Gastrointestinal: Negative.   Genitourinary: Negative.   Neurological: Negative.   Psychiatric/Behavioral: Negative.        Objective:   Physical Exam  Constitutional: She is oriented to person, place, and time. No distress.  Cardiovascular: Normal rate, regular rhythm and normal heart sounds.  Exam reveals no gallop.   No murmur heard. Pulmonary/Chest: Effort normal.  Neurological: She is alert and oriented to person, place, and time.  Psychiatric: She has a normal mood and affect. Her behavior is normal. Judgment and thought content normal.       Assessment & Plan:

## 2013-04-01 NOTE — Assessment & Plan Note (Addendum)
Discussed diet and exercise. Portion control. Will try short course of appetite suppression. During this time it is important to learn healthy new habits. Needs to eat smaller meals, more frequently. Ex: breakfast, snack, lunch, snack, dinner, snack.

## 2013-04-04 ENCOUNTER — Other Ambulatory Visit: Payer: Self-pay | Admitting: Adult Health

## 2013-04-04 MED ORDER — PHENTERMINE HCL 37.5 MG PO TABS: 37.5000 mg | ORAL_TABLET | Freq: Every day | ORAL | Status: DC

## 2013-06-14 ENCOUNTER — Other Ambulatory Visit: Payer: Self-pay

## 2013-06-14 DIAGNOSIS — Z1231 Encounter for screening mammogram for malignant neoplasm of breast: Secondary | ICD-10-CM

## 2013-06-20 ENCOUNTER — Ambulatory Visit
Admission: RE | Admit: 2013-06-20 | Discharge: 2013-06-20 | Disposition: A | Discharge status: Home / Self Care | Payer: 59 | Source: Ambulatory Visit

## 2013-06-20 DIAGNOSIS — Z1231 Encounter for screening mammogram for malignant neoplasm of breast: Secondary | ICD-10-CM

## 2014-01-11 ENCOUNTER — Encounter: Payer: Self-pay | Admitting: Family Medicine

## 2014-01-11 ENCOUNTER — Ambulatory Visit
Admission: RE | Admit: 2014-01-11 | Discharge: 2014-01-11 | Disposition: A | Discharge status: Home / Self Care | Payer: 59 | Source: Ambulatory Visit | Attending: Family Medicine | Admitting: Family Medicine

## 2014-01-11 ENCOUNTER — Ambulatory Visit (INDEPENDENT_AMBULATORY_CARE_PROVIDER_SITE_OTHER): Payer: 59 | Admitting: Family Medicine

## 2014-01-11 VITALS — BP 126/60 | HR 64 | Temp 98.2°F | Resp 14 | Ht 64.0 in | Wt 165.0 lb

## 2014-01-11 DIAGNOSIS — M25561 Pain in right knee: Secondary | ICD-10-CM

## 2014-01-11 MED ORDER — MELOXICAM 7.5 MG PO TABS: 7.5000 mg | ORAL_TABLET | Freq: Every day | ORAL | Status: DC

## 2014-01-11 NOTE — Progress Notes (Signed)
Patient ID: Victoria Garrison, female   DOB: 1970-08-24, 43 y.o.   MRN: [id number]   Subjective:    Patient ID: Victoria Garrison, female    DOB: [birth year], 43 y.o.   MRN: [id number]  Patient presents for R Knee Pain  patient here with right knee pain for the past 4-5 days. She does not remember any particular injury does not remember stepping and twisting and any abnormal way. She's no history of previous knee swelling or pain. She did have an injury to her right knee when she was 43 years old and has a scar however does not have any difficulty since then. She noticed swelling of her knee and pain when she bends it which is been on and off for the past few days. Pain is mostly on the lateral aspect of her knee in the inferior portion. She has used ice and taken a dose of Aleve a couple days ago. She denies any locking of the knees denies any giving out of the knee    Review Of Systems:  GEN- denies fatigue, fever, weight loss,weakness, recent illness HEENT- denies eye drainage, change in vision, nasal discharge, CVS- denies chest pain, palpitations RESP- denies SOB, cough, wheeze MSK-+ joint pain, muscle aches, injury       Objective:    BP 126/60  Pulse 64  Temp(Src) 98.2 F (36.8 C) (Oral)  Resp 14  Ht 5' 4"  (1.626 m)  Wt 165 lb (74.844 kg)  BMI 28.31 kg/m2 GEN- NAD, alert and oriented x3 MSK- Bilat knees normal inspection, small scar below patella right knee,  Right knee good ROM, pain with hyperflexion of knee, no effusion noted, ligaments in tact, patella tracks normally. Non antalgic gait, neg lachmans EXT- No edema         Assessment & Plan:      Problem List Items Addressed This Visit   None    Visit Diagnoses   Knee pain, acute, right    -  Primary    Unclear cause of injury, no large effusion noted, ICE knee, start mobic, xray , may need ortho Ligaments appear in tact,     Relevant Orders       DG Knee Complete 4 Views Right       Note: This dictation was  prepared with Dragon dictation along with smaller phrase technology. Any transcriptional errors that result from this process are unintentional.

## 2014-01-11 NOTE — Patient Instructions (Signed)
Xray of knee  Start mobic with food F/U as needed

## 2014-01-16 ENCOUNTER — Encounter: Payer: Self-pay | Admitting: Family Medicine

## 2014-01-24 ENCOUNTER — Telehealth: Payer: Self-pay | Admitting: Family Medicine

## 2014-01-24 DIAGNOSIS — M25561 Pain in right knee: Secondary | ICD-10-CM

## 2014-01-24 NOTE — Telephone Encounter (Signed)
Worsening knee pain, on NSAID, normal xray, no injury refer to ortho

## 2014-01-27 ENCOUNTER — Ambulatory Visit (HOSPITAL_COMMUNITY)
Admission: RE | Admit: 2014-01-27 | Discharge: 2014-01-27 | Disposition: A | Discharge status: Home / Self Care | Payer: 59 | Source: Ambulatory Visit | Attending: Family Medicine | Admitting: Family Medicine

## 2014-01-27 ENCOUNTER — Other Ambulatory Visit: Payer: Self-pay | Admitting: Family Medicine

## 2014-01-27 ENCOUNTER — Telehealth: Payer: Self-pay | Admitting: Family Medicine

## 2014-01-27 DIAGNOSIS — M25461 Effusion, right knee: Secondary | ICD-10-CM | POA: Diagnosis not present

## 2014-01-27 DIAGNOSIS — M25561 Pain in right knee: Secondary | ICD-10-CM

## 2014-01-27 DIAGNOSIS — M94261 Chondromalacia, right knee: Secondary | ICD-10-CM | POA: Insufficient documentation

## 2014-01-27 DIAGNOSIS — R937 Abnormal findings on diagnostic imaging of other parts of musculoskeletal system: Secondary | ICD-10-CM | POA: Insufficient documentation

## 2014-01-27 NOTE — Telephone Encounter (Signed)
Pt here in office worsening right knee pain, felt severe pain medial aspect after turning awkardly , had some swelling. Taking NSAIDS  Pain with ROM, concern for possible meniscal injury  MRI of knee to be done

## 2014-01-30 ENCOUNTER — Telehealth: Payer: Self-pay | Admitting: Orthopedic Surgery

## 2014-01-30 NOTE — Telephone Encounter (Signed)
1pm tues or 1pm thurs

## 2014-01-30 NOTE — Telephone Encounter (Signed)
Call received from Patient's primary care provider, and employer, Dr. Vic Blackbird, regarding right knee problem, for which patient has been scheduled for appointment, 02/16/2014.  Dr. Buelah Manis requests appointment as soon as possible. (Patient has previously worked for our office and for [employer]).Based on current schedule, please review and please advise. Ph# [phone].

## 2014-01-30 NOTE — Telephone Encounter (Signed)
Called patient; moved appointment to this week as per Dr Aline Brochure (for tomorrow, 01/31/14)

## 2014-01-31 ENCOUNTER — Ambulatory Visit (INDEPENDENT_AMBULATORY_CARE_PROVIDER_SITE_OTHER): Payer: 59 | Admitting: Orthopedic Surgery

## 2014-01-31 ENCOUNTER — Encounter: Payer: Self-pay | Admitting: Orthopedic Surgery

## 2014-01-31 VITALS — BP 126/75 | Ht 62.0 in | Wt 160.0 lb

## 2014-01-31 DIAGNOSIS — M222X9 Patellofemoral disorders, unspecified knee: Secondary | ICD-10-CM | POA: Insufficient documentation

## 2014-01-31 DIAGNOSIS — M222X2 Patellofemoral disorders, left knee: Secondary | ICD-10-CM | POA: Diagnosis not present

## 2014-01-31 NOTE — Patient Instructions (Addendum)
Continue Meloxicam and exercises x 6 weeks  Joint Injection Care After Refer to this sheet in the next few days. These instructions provide you with information on caring for yourself after you have had a joint injection. Your caregiver also may give you more specific instructions. Your treatment has been planned according to current medical practices, but problems sometimes occur. Call your caregiver if you have any problems or questions after your procedure. After any type of joint injection, it is not uncommon to experience:  Soreness, swelling, or bruising around the injection site.  Mild numbness, tingling, or weakness around the injection site caused by the numbing medicine used before or with the injection. It also is possible to experience the following effects associated with the specific agent after injection:  Iodine-based contrast agents:  Allergic reaction (itching, hives, widespread redness, and swelling beyond the injection site).  Corticosteroids (These effects are rare.):  Allergic reaction.  Increased blood sugar levels (If you have diabetes and you notice that your blood sugar levels have increased, notify your caregiver).  Increased blood pressure levels.  Mood swings.  Hyaluronic acid in the use of viscosupplementation.  Temporary heat or redness.  Temporary rash and itching.  Increased fluid accumulation in the injected joint. These effects all should resolve within a day after your procedure.  HOME CARE INSTRUCTIONS  Limit yourself to light activity the day of your procedure. Avoid lifting heavy objects, bending, stooping, or twisting.  Take prescription or over-the-counter pain medication as directed by your caregiver.  You may apply ice to your injection site to reduce pain and swelling the day of your procedure. Ice may be applied 03-04 times:  Put ice in a plastic bag.  Place a towel between your skin and the bag.  Leave the ice on for no  longer than 15-20 minutes each time. SEEK IMMEDIATE MEDICAL CARE IF:   Pain and swelling get worse rather than better or extend beyond the injection site.  Numbness does not go away.  Blood or fluid continues to leak from the injection site.  You have chest pain.  You have swelling of your face or tongue.  You have trouble breathing or you become dizzy.  You develop a fever, chills, or severe tenderness at the injection site that last longer than 1 day. MAKE SURE YOU:  Understand these instructions.  Watch your condition.  Get help right away if you are not doing well or if you get worse. Document Released: 11/14/2010 Document Revised: 05/26/2011 Document Reviewed: 11/14/2010 [facility] Patient Information 2015 [place], [place]. This information is not intended to replace advice given to you by your health care provider. Make sure you discuss any questions you have with your health care provider.

## 2014-01-31 NOTE — Progress Notes (Signed)
Patient ID: Victoria Garrison, female   DOB: 1970-06-30, 43 y.o.   MRN: [id number] Chief Complaint  Patient presents with  . Knee Pain    Right knee pain, no known injury   Patient ID: Victoria Garrison, female   DOB: Mar 07, 1971, 43 y.o.   MRN: [id number]  Chief Complaint  Patient presents with  . Knee Pain    Right knee pain, no known injury    HPI Victoria Garrison is a 43 y.o. female.  3-4 week history of atraumatic onset of anterior knee pain associated with dull aching pain swelling and a feeling that the knee Is numb. It's worse with activity relieved partially with Advil and meloxicam ice and elevation. It is aggravated by squatting stairs and walking.HPI  Past Medical History  Diagnosis Date  . Abnormal Pap smear   . HSV-1 (herpes simplex virus 1) infection   . Menorrhagia   . Dysmenorrhea   . Fibroadenoma of left breast   . Fibroids 09/02/2012  . Anemia     Due to heavy menustral cycles    Past Surgical History  Procedure Laterality Date  . Cholecystectomy  2014  . Vaginal delivery      3  . Breast fibroadenoma surgery    . Dilitation & currettage/hystroscopy with thermachoice ablation N/A 11/04/2012    Procedure: HYSTEROSCOPY; DILATATION AND CURETTAGE;THERMACHOICE ENDOMETRIAL ABLATION (Total Therapy Time=9 min 18sec);  Surgeon: Jonnie Kind, MD;  Location: [facility];  Service: Gynecology;  Laterality: N/A;    Family History  Problem Relation Age of Onset  . Diabetes Mother   . Hypertension Mother   . Cancer Father     basal cell skin  . Cancer Maternal Aunt     breast  . CAD Maternal Aunt   . CAD Paternal Grandmother   . Cancer Paternal Grandmother     breast    Social History History  Substance Use Topics  . Smoking status: Never Smoker   . Smokeless tobacco: Never Used  . Alcohol Use: Yes     Comment: occassionaly    Allergies  Allergen Reactions  . Guaifenesin     Hives     Current Outpatient Prescriptions  Medication Sig Dispense Refill  .  meloxicam (MOBIC) 7.5 MG tablet Take 1 tablet (7.5 mg total) by mouth daily. 30 tablet 1   No current facility-administered medications for this visit.    Review of Systems Review of Systems No abnormalities. Blood pressure 126/75, height 5' 2"  (1.575 m), weight 160 lb (72.576 kg).  Physical Exam Physical Exam Vitals recorded stable. Appearance meticulously groomed. No congenital abnormalities are noted. Full development noted. Oriented 3. Mood and affect normal. Gait and station normal.  Left knee crepitance in the patellofemoral joint mild discomfort. Full range of motion, all ligament stable. Negative apprehension. Motor exam normal. Skin normal.  Right knee positive quadriceps active test patellofemoral tenderness palpable plica tender full range of motion. Ligament stable. Motor exam normal. Skin normal.  Bilateral neurovascular exam normal pulse and perfusion normal sensation. Data Reviewed My interpretation of her MRI and plain film is that her plain films are normal and her MRI of the knee shows no ligament or meniscal pathology  Assessment    Encounter Diagnosis  Name Primary?  . PFS (patellofemoral syndrome), left Yes        Plan    Injection of the joint Continue meloxicam Quadriceps exercises for 6 weeks Follow-up as needed  Procedure note right knee injection verbal consent  was obtained to inject right knee joint  Timeout was completed to confirm the site of injection  The medications used were 40 mg of Depo-Medrol and 1% lidocaine 3 cc  Anesthesia was provided by ethyl chloride and the skin was prepped with alcohol.  After cleaning the skin with alcohol a 20-gauge needle was used to inject the right knee joint. There were no complications. A sterile bandage was applied.          Arther Abbott 01/31/2014, 1:20 PM

## 2014-02-16 ENCOUNTER — Ambulatory Visit: Payer: 59 | Admitting: Orthopedic Surgery

## 2014-05-04 ENCOUNTER — Encounter: Payer: Self-pay | Admitting: Physician Assistant

## 2014-05-04 ENCOUNTER — Ambulatory Visit (INDEPENDENT_AMBULATORY_CARE_PROVIDER_SITE_OTHER): Payer: 59 | Admitting: Physician Assistant

## 2014-05-04 VITALS — BP 118/70 | HR 76 | Temp 98.6°F | Resp 18 | Wt 172.0 lb

## 2014-05-04 DIAGNOSIS — H8113 Benign paroxysmal vertigo, bilateral: Secondary | ICD-10-CM

## 2014-05-04 MED ORDER — MECLIZINE HCL 25 MG PO TABS: 25.0000 mg | ORAL_TABLET | Freq: Three times a day (TID) | ORAL | Status: DC | PRN

## 2014-05-04 NOTE — Progress Notes (Signed)
    Patient ID: Victoria Garrison MRN: [id number], DOB: March 01, 1971, 44 y.o. Date of Encounter: 05/04/2014, 2:41 PM    Chief Complaint:  Chief Complaint  Patient presents with  . c/o dizzy spells    off/on x 2 weeks, happens also in bed at night     HPI: 44 y.o. year old white female presents with the above symptoms.  She states that with one of her first episodes, which occurred about 2-3 weeks ago, she had vertigo with true spinning sensation, also had vomiting. Says that she also was even feeling staggery with that episode. Says that since then, she has mostly just had episodes during the night when she rolls over and changes position in bed. At that time she will have vertigo with spinning sensation. Otherwise, during the day- she really hasn't had any symptoms since that one initial episode. Has noticed no other additional symptoms or neurologic changes/neurologic deficits.     Home Meds:   Outpatient Prescriptions Prior to Visit  Medication Sig Dispense Refill  . meloxicam (MOBIC) 7.5 MG tablet Take 1 tablet (7.5 mg total) by mouth daily. 30 tablet 1   No facility-administered medications prior to visit.    Allergies:  Allergies  Allergen Reactions  . Guaifenesin     Hives       Review of Systems: See HPI for pertinent ROS. All other ROS negative.    Physical Exam: Blood pressure 118/70, pulse 76, temperature 98.6 F (37 C), temperature source Oral, resp. rate 18, weight 172 lb (78.019 kg)., Body mass index is 31.45 kg/(m^2). General:  WNWD WF. Appears in no acute distress. Neck: Supple. No thyromegaly. No lymphadenopathy. No carotid bruit. Lungs: Clear bilaterally to auscultation without wheezes, rales, or rhonchi. Breathing is unlabored. Heart: Regular rhythm. No murmurs, rubs, or gallops. Msk:  Strength and tone normal for age. DixHallPikeManeuver: Positive Bilaterally. When she turns her head to the right, this causes vertigo.  When she turns to the left,  this causes vertigo.  Extremities/Skin: Warm and dry. Neuro: Alert and oriented X 3. Moves all extremities spontaneously. Gait is normal. CNII-XII grossly in tact. Romberg normal.  Psych:  Responds to questions appropriately with a normal affect.     ASSESSMENT AND PLAN:  44 y.o. year old female with  1. Benign paroxysmal positional vertigo, bilateral - meclizine (ANTIVERT) 25 MG tablet; Take 1 tablet (25 mg total) by mouth 3 (three) times daily as needed for dizziness.  Dispense: 30 tablet; Refill: 0 She is to take meclizine as needed. If symptoms do not resolve over the next 1-2 weeks,  she is to follow-up and will  refer to ENT for Epley maneuvers.   Marin Olp [place], [place], [facility] 05/04/2014 2:41 PM

## 2014-05-23 ENCOUNTER — Encounter: Payer: Self-pay | Admitting: Family Medicine

## 2014-06-22 ENCOUNTER — Other Ambulatory Visit: Payer: Self-pay

## 2014-06-22 DIAGNOSIS — Z1231 Encounter for screening mammogram for malignant neoplasm of breast: Secondary | ICD-10-CM

## 2014-06-28 ENCOUNTER — Ambulatory Visit: Payer: 59

## 2014-07-07 NOTE — Op Note (Signed)
DATE OF BIRTH:  March 03, 1971  DATE OF PROCEDURE:  03/24/2012  PREOPERATIVE DIAGNOSIS:  Cholecystitis, cholelithiasis.   POSTOPERATIVE DIAGNOSIS:  Acute cholecystitis, cholelithiasis.   PROCEDURE:  Laparoscopic cholecystectomy, cholangiogram.   SURGEON:  Loreli Dollar, MD    ANESTHESIA:  General.   INDICATIONS:  This 44 year old female came in with acute onset of epigastric pain, ultrasound findings of gallstones, and surgery was recommended for definitive treatment.   DESCRIPTION OF PROCEDURE:  The patient was placed on the operating table in the supine position under general endotracheal anesthesia. The abdomen was prepared with ChloraPrep and draped in a sterile manner. A short incision was made in the inferior aspect of the umbilicus and carried down to the deep fascia, which was grasped with laryngeal hook and elevated. There was a small umbilical hernia defect. Some properitoneal fat was dissected free from surrounding structures and was inverted. A Veress needle was inserted, aspirated and irrigated with a saline solution. Next, the peritoneal cavity was inflated with carbon dioxide. The Veress needle was removed. The 10-mm cannula was inserted. The 10-mm, 0-degree laparoscope was inserted to view the peritoneal cavity. The liver appeared normal. The gallbladder was markedly distended and acutely inflamed, and had a hemorrhagic appearance. The uterus appeared to be moderately enlarged and smooth. The right ovary appeared normal. The fimbria was identified and appeared normal.   With the patient in the reverse Trendelenburg position and turned several degrees to the left, the gallbladder was further examined and was decompressed with a lancing needle, draining white bile. The gallbladder was then retracted towards the right shoulder. The pouch of Randol Kern was retracted inferiorly and laterally. There was edema of the gallbladder wall. Fatty tissue surrounding the gallbladder neck was dissected  away from the gallbladder neck and exposed the cystic duct, which was dissected free from surrounding structures. The cystic artery was dissected free from surrounding structures. The gallbladder neck was further mobilized with incision of visceral peritoneum. A critical view of safety was demonstrated. An endoclip was placed across the cystic duct adjacent to the neck of the gallbladder. An incision was made in the cystic duct to introduce a Reddick catheter.   Half-strength Conray-60 dye was injected as the cholangiogram was done with fluoroscopy viewing the biliary tree and flow of dye into the duodenum. The cholangiogram appeared normal. The Reddick catheter was removed. The cystic duct was doubly ligated with endoclips and divided. The cystic artery was controlled with double endoclips and divided. One other small branch of the cystic artery was controlled with a single endoclip and divided. The gallbladder was dissected free from the liver with a somewhat tedious dissection. Numerous small bleeding points were cauterized. The site was irrigated multiple times and aspirated. The gallbladder was completely separated, and the gallbladder bed was further examined for several minutes. Hemostasis was intact. The gallbladder was brought up into the infraumbilical incision and opened and suctioned, and the incision was lengthened by about 8 mm, and also the fascial incision was lengthened by about 8 mm.   Next, the gallbladder was opened and suctioned. Multiple stones were removed with the stone forceps and also with stone scoop, and after removal of all stones, the gallbladder could then be pulled up through the incision and was submitted in formalin with stones for routine pathology. The right upper quadrant was further inspected, irrigated and aspirated. Hemostasis was intact. The cannulas were removed, allowing carbon dioxide to escape from the peritoneal cavity. Next, the fascial defect at the umbilicus was  closed with 0 Vicryl figure-of-eight sutures placing 2 figure-of-eight sutures. This obliterated the umbilical hernia defect and also approximated the fascia. Next, all skin incisions were closed with interrupted 5-0 chromic subcuticular sutures, Benzoin and Steri-Strips. Dressings were applied with paper tape. The patient tolerated surgery satisfactorily and was then prepared for transfer to the recovery room.   ____________________________ Lenna Sciara. Rochel Brome, MD jws:ms D: 03/24/2012 17:53:19 ET T: 03/24/2012 19:19:17 ET JOB#: [id number]  cc: Loreli Dollar, MD, <Dictator> Loreli Dollar MD ELECTRONICALLY SIGNED 03/26/2012 18:59

## 2014-07-12 ENCOUNTER — Ambulatory Visit
Admission: RE | Admit: 2014-07-12 | Discharge: 2014-07-12 | Disposition: A | Discharge status: Home / Self Care | Payer: 59 | Source: Ambulatory Visit

## 2014-07-12 DIAGNOSIS — Z1231 Encounter for screening mammogram for malignant neoplasm of breast: Secondary | ICD-10-CM

## 2015-02-19 ENCOUNTER — Telehealth: Payer: Self-pay | Admitting: *Deleted

## 2015-02-19 DIAGNOSIS — Z139 Encounter for screening, unspecified: Secondary | ICD-10-CM

## 2015-02-19 NOTE — Telephone Encounter (Signed)
Will put order in for labs with solatas

## 2015-02-20 ENCOUNTER — Other Ambulatory Visit: Payer: Self-pay | Admitting: Family Medicine

## 2015-02-20 ENCOUNTER — Other Ambulatory Visit: Payer: 59

## 2015-02-20 DIAGNOSIS — Z0000 Encounter for general adult medical examination without abnormal findings: Secondary | ICD-10-CM

## 2015-02-20 DIAGNOSIS — Z833 Family history of diabetes mellitus: Secondary | ICD-10-CM

## 2015-02-20 LAB — COMPLETE METABOLIC PANEL WITH GFR
ALBUMIN: 4.1 g/dL (ref 3.6–5.1)
ALK PHOS: 68 U/L (ref 33–115)
ALT: 15 U/L (ref 6–29)
AST: 15 U/L (ref 10–30)
BILIRUBIN TOTAL: 0.6 mg/dL (ref 0.2–1.2)
BUN: 8 mg/dL (ref 7–25)
CO2: 28 mmol/L (ref 20–31)
CREATININE: 0.66 mg/dL (ref 0.50–1.10)
Calcium: 8.5 mg/dL — ABNORMAL LOW (ref 8.6–10.2)
Chloride: 106 mmol/L (ref 98–110)
GFR, Est African American: >89 mL/min (ref >=60)
GLUCOSE: 88 mg/dL (ref 70–99)
Potassium: 4 mmol/L (ref 3.5–5.3)
SODIUM: 141 mmol/L (ref 135–146)
TOTAL PROTEIN: 6.5 g/dL (ref 6.1–8.1)

## 2015-02-20 LAB — CBC WITH DIFFERENTIAL/PLATELET
BASOS PCT: 0 % (ref 0–1)
Basophils Absolute: 0 10*3/uL (ref 0.0–0.1)
Eosinophils Absolute: 0.2 10*3/uL (ref 0.0–0.7)
Eosinophils Relative: 2 % (ref 0–5)
HCT: 34.3 % — ABNORMAL LOW (ref 36.0–46.0)
HEMOGLOBIN: 10.8 g/dL — AB (ref 12.0–15.0)
Lymphocytes Relative: 30 % (ref 12–46)
Lymphs Abs: 2.3 10*3/uL (ref 0.7–4.0)
MCH: 24.2 pg — ABNORMAL LOW (ref 26.0–34.0)
MCHC: 31.5 g/dL (ref 30.0–36.0)
MCV: 76.9 fL — ABNORMAL LOW (ref 78.0–100.0)
MPV: 8.9 fL (ref 8.6–12.4)
Monocytes Absolute: 0.6 10*3/uL (ref 0.1–1.0)
Monocytes Relative: 8 % (ref 3–12)
NEUTROS ABS: 4.5 10*3/uL (ref 1.7–7.7)
NEUTROS PCT: 60 % (ref 43–77)
PLATELETS: 414 10*3/uL — AB (ref 150–400)
RBC: 4.46 MIL/uL (ref 3.87–5.11)
RDW: 14.9 % (ref 11.5–15.5)
WBC: 7.5 10*3/uL (ref 4.0–10.5)

## 2015-02-20 LAB — LIPID PANEL
Cholesterol: 175 mg/dL (ref 125–200)
HDL: 57 mg/dL (ref >=46)
LDL CALC: 105 mg/dL (ref <130)
Total CHOL/HDL Ratio: 3.1 Ratio (ref <=5.0)
Triglycerides: 67 mg/dL (ref <150)
VLDL: 13 mg/dL (ref <30)

## 2015-02-20 LAB — HEMOGLOBIN A1C
HEMOGLOBIN A1C: 5.3 % (ref <5.7)
MEAN PLASMA GLUCOSE: 105 mg/dL (ref <117)

## 2015-02-20 LAB — TSH: TSH: 1.553 u[IU]/mL (ref 0.350–4.500)

## 2015-02-21 ENCOUNTER — Other Ambulatory Visit: Payer: 59 | Admitting: Adult Health

## 2015-02-22 LAB — IRON AND TIBC
%SAT: 8 % — ABNORMAL LOW (ref 11–50)
IRON: 33 ug/dL — AB (ref 40–190)
TIBC: 420 ug/dL (ref 250–450)
UIBC: 387 ug/dL (ref 125–400)

## 2015-03-21 ENCOUNTER — Other Ambulatory Visit (HOSPITAL_COMMUNITY)
Admission: RE | Admit: 2015-03-21 | Discharge: 2015-03-21 | Disposition: A | Discharge status: Home / Self Care | Payer: BLUE CROSS/BLUE SHIELD | Source: Ambulatory Visit | Attending: Adult Health | Admitting: Adult Health

## 2015-03-21 ENCOUNTER — Ambulatory Visit (INDEPENDENT_AMBULATORY_CARE_PROVIDER_SITE_OTHER): Payer: 59 | Admitting: Adult Health

## 2015-03-21 ENCOUNTER — Encounter: Payer: Self-pay | Admitting: Adult Health

## 2015-03-21 VITALS — BP 140/82 | HR 96 | Ht 61.25 in | Wt 164.5 lb

## 2015-03-21 DIAGNOSIS — Z01419 Encounter for gynecological examination (general) (routine) without abnormal findings: Secondary | ICD-10-CM

## 2015-03-21 DIAGNOSIS — Z1151 Encounter for screening for human papillomavirus (HPV): Secondary | ICD-10-CM | POA: Insufficient documentation

## 2015-03-21 DIAGNOSIS — Z1212 Encounter for screening for malignant neoplasm of rectum: Secondary | ICD-10-CM

## 2015-03-21 LAB — HEMOCCULT GUIAC POC 1CARD (OFFICE): FECAL OCCULT BLD: NEGATIVE

## 2015-03-21 NOTE — Progress Notes (Signed)
Patient ID: Victoria Garrison, female   DOB: 04/25/1970, 45 y.o.   MRN: [id number] History of Present Illness: Victoria Garrison is a 45 year old white female, married in for a well woman gyn exam and pap.No complaints. PCP is Dr Penermon.Had labs with Dr Buelah Manis, was anemic, to take iron, and got flu shot in October at work.  Current Medications, Allergies, Past Medical History, Past Surgical History, Family History and Social History were reviewed in [facility] record.     Review of Systems:  Patient denies any headaches, hearing loss, fatigue, blurred vision, shortness of breath, chest pain, abdominal pain, problems with bowel movements, urination, or intercourse. No joint pain or mood swings.   Physical Exam:BP 140/82 mmHg  Pulse 96  Ht 5' 1.25" (1.556 m)  Wt 164 lb 8 oz (74.617 kg)  BMI 30.82 kg/m2 General:  Well developed, well nourished, no acute distress Skin:  Warm and dry Neck:  Midline trachea, normal thyroid, good ROM, no lymphadenopathy Lungs; Clear to auscultation bilaterally Breast:  No dominant palpable mass, retraction, or nipple discharge Cardiovascular: Regular rate and rhythm Abdomen:  Soft, non tender, no hepatosplenomegaly Pelvic:  External genitalia is normal in appearance, no lesions.  The vagina is normal in appearance. Urethra has no lesions or masses. The cervix is bulbous,pap with HPV performed.  Uterus is felt to be normal size, shape, and contour.  No adnexal masses or tenderness noted.Bladder is non tender, no masses felt. Rectal: Good sphincter tone, no polyps, or hemorrhoids felt.  Hemoccult negative. Extremities/musculoskeletal:  No swelling or varicosities noted, no clubbing or cyanosis Psych:  No mood changes, alert and cooperative,seems happy   Impression: Well woman gyn exam and pap    Plan: Physical in 1 year, pap in 3 if normal Labs with PCP Mammogram yearly

## 2015-03-21 NOTE — Patient Instructions (Signed)
Mammogram yearly Physical in 1 year Pap in 3 if normal Labs with PCP

## 2015-03-28 LAB — CYTOLOGY - PAP

## 2015-06-27 ENCOUNTER — Telehealth: Payer: Self-pay | Admitting: Internal Medicine

## 2015-06-27 ENCOUNTER — Other Ambulatory Visit: Payer: Self-pay

## 2015-06-27 DIAGNOSIS — Z1231 Encounter for screening mammogram for malignant neoplasm of breast: Secondary | ICD-10-CM

## 2015-06-27 NOTE — Telephone Encounter (Signed)
Pt has an appt on 05/19. Pt was called. :)

## 2015-06-27 NOTE — Telephone Encounter (Signed)
Please advise as you are not listed as the PCP for this patient? Thanks

## 2015-06-27 NOTE — Telephone Encounter (Signed)
Needs to be seen

## 2015-06-27 NOTE — Telephone Encounter (Signed)
Pt is going on a cruise on 07/13/2015 and needs something for cruise and flight. Flight is 9 hours. Pt is scheduled for a appt on 08/03/2015. Pharmacy is [facility], [place]. Call pt @ [phone]. Thank you!

## 2015-06-27 NOTE — Telephone Encounter (Signed)
Patient needs an appt for this request, please schedule. thanks

## 2015-07-24 ENCOUNTER — Ambulatory Visit: Payer: 59

## 2015-08-01 ENCOUNTER — Ambulatory Visit
Admission: RE | Admit: 2015-08-01 | Discharge: 2015-08-01 | Disposition: A | Discharge status: Home / Self Care | Payer: 59 | Source: Ambulatory Visit

## 2015-08-01 DIAGNOSIS — Z1231 Encounter for screening mammogram for malignant neoplasm of breast: Secondary | ICD-10-CM

## 2015-08-03 ENCOUNTER — Ambulatory Visit: Payer: 59 | Admitting: Internal Medicine

## 2015-11-28 ENCOUNTER — Encounter: Payer: Self-pay | Admitting: Family Medicine

## 2016-03-27 ENCOUNTER — Telehealth: Payer: Self-pay | Admitting: Physician Assistant

## 2016-03-27 DIAGNOSIS — J0100 Acute maxillary sinusitis, unspecified: Secondary | ICD-10-CM

## 2016-03-27 DIAGNOSIS — B9689 Other specified bacterial agents as the cause of diseases classified elsewhere: Secondary | ICD-10-CM

## 2016-03-27 DIAGNOSIS — J988 Other specified respiratory disorders: Secondary | ICD-10-CM

## 2016-03-27 MED ORDER — PREDNISONE 20 MG PO TABS: ORAL_TABLET | ORAL | 0 refills | Status: DC

## 2016-03-27 MED ORDER — AMOXICILLIN-POT CLAVULANATE 875-125 MG PO TABS
1.0000 | ORAL_TABLET | Freq: Two times a day (BID) | ORAL | 0 refills | Status: DC
Start: 2016-03-27 — End: 2016-05-07

## 2016-03-27 MED FILL — predniSONE 20 MG TABS: 20 | 6 days supply | Qty: 12 | Fill #0

## 2016-03-27 MED FILL — AMOX-CLAV 875-125 MG TABLET: 875-125 | 10 days supply | Qty: 20 | Fill #0

## 2016-03-27 NOTE — Telephone Encounter (Signed)
She reports that she has been having congestion in her head and nose for a week now. Getting thick dark mucus from the nose. Now is having significant pressure pain and swelling in the right maxillary sinus region. Symptoms getting worse in general. Has had no chest congestion. No significant amount of sore throat. No fevers or chills.

## 2016-05-07 ENCOUNTER — Encounter: Payer: Self-pay | Admitting: Adult Health

## 2016-05-07 ENCOUNTER — Ambulatory Visit (INDEPENDENT_AMBULATORY_CARE_PROVIDER_SITE_OTHER): Payer: 59 | Admitting: Adult Health

## 2016-05-07 VITALS — BP 100/60 | HR 99 | Ht 61.5 in | Wt 163.0 lb

## 2016-05-07 DIAGNOSIS — Z01419 Encounter for gynecological examination (general) (routine) without abnormal findings: Secondary | ICD-10-CM | POA: Insufficient documentation

## 2016-05-07 DIAGNOSIS — Z1212 Encounter for screening for malignant neoplasm of rectum: Secondary | ICD-10-CM

## 2016-05-07 DIAGNOSIS — K649 Unspecified hemorrhoids: Secondary | ICD-10-CM | POA: Insufficient documentation

## 2016-05-07 DIAGNOSIS — Z1211 Encounter for screening for malignant neoplasm of colon: Secondary | ICD-10-CM | POA: Insufficient documentation

## 2016-05-07 DIAGNOSIS — Z131 Encounter for screening for diabetes mellitus: Secondary | ICD-10-CM | POA: Diagnosis not present

## 2016-05-07 LAB — HEMOCCULT GUIAC POC 1CARD (OFFICE): Fecal Occult Blood, POC: NEGATIVE

## 2016-05-07 MED ORDER — HYDROCORTISONE ACE-PRAMOXINE 1-1 % RE FOAM: 1.0000 | Freq: Two times a day (BID) | RECTAL | 1 refills | Status: DC

## 2016-05-07 MED FILL — PROCTOFOAM-HC 1%-1% FOAM: 1-1 | 10 days supply | Qty: 10 | Fill #0

## 2016-05-07 NOTE — Progress Notes (Signed)
Patient ID: Victoria Garrison, female   DOB: 07-Sep-1970, 46 y.o.   MRN: [id number] History of Present Illness: Gerda is 46 year old white female married in for a well woman gyn exam, she had a normal pap with negative HPV 03/21/15. She thinks she may have hemorrhoid, itches at night. She doe not have PCP currently.   Current Medications, Allergies, Past Medical History, Past Surgical History, Family History and Social History were reviewed in [facility] record.     Review of Systems: Patient denies any headaches, hearing loss, fatigue, blurred vision, shortness of breath, chest pain, abdominal pain, problems with bowel movements, urination, or intercourse. No joint pain or mood swings.?hemorrhoid, itches at night    Physical Exam:BP 100/60 (BP Location: Left Arm, Patient Position: Sitting, Cuff Size: Normal)   Pulse 99   Ht 5' 1.5" (1.562 m)   Wt 163 lb (73.9 kg)   BMI 30.30 kg/m  General:  Well developed, well nourished, no acute distress Skin:  Warm and dry Neck:  Midline trachea, normal thyroid, good ROM, no lymphadenopathy Lungs; Clear to auscultation bilaterally Breast:  No dominant palpable mass, retraction, or nipple discharge Cardiovascular: Regular rate and rhythm Abdomen:  Soft, non tender, no hepatosplenomegaly Pelvic:  External genitalia is normal in appearance, no lesions.  The vagina is normal in appearance. Urethra has no lesions or masses. The cervix is bulbous.  Uterus is felt to be normal size, shape, and contour.  No adnexal masses or tenderness noted.Bladder is non tender, no masses felt. Rectal: Good sphincter tone, no polyps, + hemorrhoids felt.  Hemoccult negative. Extremities/musculoskeletal:  No swelling or varicosities noted, no clubbing or cyanosis Psych:  No mood changes, alert and cooperative,seems happy PHQ 2 score 0.  Impression: 1. Well woman exam with routine gynecological exam   2. Screening for colorectal cancer   3.  Screening for diabetes mellitus   4. Hemorrhoids, unspecified hemorrhoid type       Plan: Check CBC,CMP,TSH and lipids,A1c and vitamin D Meds ordered this encounter  Medications  . hydrocortisone-pramoxine (PROCTOFOAM HC) rectal foam    Sig: Place 1 applicator rectally 2 (two) times daily.    Dispense:  10 g    Refill:  1    Order Specific Question:   Supervising Provider    Answer:   Florian Buff [[id number]]   Physical in 1 year Pap in 2020 Mammogram yearly

## 2016-05-08 LAB — COMPREHENSIVE METABOLIC PANEL
A/G RATIO: 1.8 (ref 1.2–2.2)
ALT: 29 IU/L (ref 0–32)
AST: 19 IU/L (ref 0–40)
Albumin: 4.5 g/dL (ref 3.5–5.5)
Alkaline Phosphatase: 66 IU/L (ref 39–117)
BUN/Creatinine Ratio: 11 (ref 9–23)
BUN: 8 mg/dL (ref 6–24)
Bilirubin Total: 0.6 mg/dL (ref 0.0–1.2)
CALCIUM: 9.1 mg/dL (ref 8.7–10.2)
CO2: 23 mmol/L (ref 18–29)
CREATININE: 0.76 mg/dL (ref 0.57–1.00)
Chloride: 101 mmol/L (ref 96–106)
GFR, EST AFRICAN AMERICAN: 110 (ref >59)
GFR, EST NON AFRICAN AMERICAN: 96 (ref >59)
GLOBULIN, TOTAL: 2.5 (ref 1.5–4.5)
Glucose: 85 mg/dL (ref 65–99)
POTASSIUM: 4.3 mmol/L (ref 3.5–5.2)
Sodium: 141 mmol/L (ref 134–144)
TOTAL PROTEIN: 7 g/dL (ref 6.0–8.5)

## 2016-05-08 LAB — LIPID PANEL
CHOLESTEROL TOTAL: 191 mg/dL (ref 100–199)
Chol/HDL Ratio: 3.4 (ref 0.0–4.4)
HDL: 56 mg/dL (ref >39)
LDL Calculated: 118 — ABNORMAL HIGH (ref 0–99)
TRIGLYCERIDES: 87 mg/dL (ref 0–149)
VLDL Cholesterol Cal: 17 (ref 5–40)

## 2016-05-08 LAB — CBC
HEMATOCRIT: 42.3 % (ref 34.0–46.6)
Hemoglobin: 14 g/dL (ref 11.1–15.9)
MCH: 28.6 pg (ref 26.6–33.0)
MCHC: 33.1 g/dL (ref 31.5–35.7)
MCV: 86 fL (ref 79–97)
PLATELETS: 361 10*3/uL (ref 150–379)
RBC: 4.9 x10E6/uL (ref 3.77–5.28)
RDW: 13.9 % (ref 12.3–15.4)
WBC: 7.5 10*3/uL (ref 3.4–10.8)

## 2016-05-08 LAB — VITAMIN D 25 HYDROXY (VIT D DEFICIENCY, FRACTURES): Vit D, 25-Hydroxy: 27.6 ng/mL — ABNORMAL LOW (ref 30.0–100.0)

## 2016-05-08 LAB — HEMOGLOBIN A1C
Est. average glucose Bld gHb Est-mCnc: 100
Hgb A1c MFr Bld: 5.1 % (ref 4.8–5.6)

## 2016-05-08 LAB — TSH: TSH: 2.05 u[IU]/mL (ref 0.450–4.500)

## 2016-06-17 ENCOUNTER — Other Ambulatory Visit: Payer: Self-pay | Admitting: Adult Health

## 2016-06-17 DIAGNOSIS — Z1231 Encounter for screening mammogram for malignant neoplasm of breast: Secondary | ICD-10-CM

## 2016-08-06 ENCOUNTER — Ambulatory Visit
Admission: RE | Admit: 2016-08-06 | Discharge: 2016-08-06 | Disposition: A | Discharge status: Home / Self Care | Payer: 59 | Source: Ambulatory Visit | Attending: Adult Health | Admitting: Adult Health

## 2016-08-06 DIAGNOSIS — Z1231 Encounter for screening mammogram for malignant neoplasm of breast: Secondary | ICD-10-CM

## 2017-05-12 ENCOUNTER — Encounter: Payer: Self-pay | Admitting: Adult Health

## 2017-05-12 ENCOUNTER — Ambulatory Visit (INDEPENDENT_AMBULATORY_CARE_PROVIDER_SITE_OTHER): Payer: 59 | Admitting: Adult Health

## 2017-05-12 VITALS — BP 120/80 | HR 92 | Ht 62.0 in | Wt 170.0 lb

## 2017-05-12 DIAGNOSIS — Z1212 Encounter for screening for malignant neoplasm of rectum: Secondary | ICD-10-CM | POA: Diagnosis not present

## 2017-05-12 DIAGNOSIS — Z01419 Encounter for gynecological examination (general) (routine) without abnormal findings: Secondary | ICD-10-CM

## 2017-05-12 DIAGNOSIS — Z01411 Encounter for gynecological examination (general) (routine) with abnormal findings: Secondary | ICD-10-CM

## 2017-05-12 DIAGNOSIS — Z1211 Encounter for screening for malignant neoplasm of colon: Secondary | ICD-10-CM

## 2017-05-12 DIAGNOSIS — K649 Unspecified hemorrhoids: Secondary | ICD-10-CM | POA: Diagnosis not present

## 2017-05-12 LAB — HEMOCCULT GUIAC POC 1CARD (OFFICE): FECAL OCCULT BLD: NEGATIVE

## 2017-05-12 MED ORDER — CHOLECALCIFEROL 125 MCG (5000 UT) PO CAPS: 5000.0000 [IU] | ORAL_CAPSULE | Freq: Every day | ORAL | Status: DC

## 2017-05-12 NOTE — Progress Notes (Signed)
Patient ID: Victoria Garrison, female   DOB: 11-Dec-1970, 47 y.o.   MRN: [id number] History of Present Illness: Victoria Garrison is a 47 year old white female, married in for well woman gyn exam, she had normal pap with negative HPV 03/21/15. 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   Current Medications, Allergies, Past Medical History, Past Surgical History, Family History and Social History were reviewed in [facility] record.     Review of Systems: Patient denies any headaches, hearing loss, fatigue, blurred vision, shortness of breath, chest pain, abdominal pain, problems with bowel movements, urination, or intercourse. No joint pain or mood swings. Has had some spotting and cramping, but not regularly, is sp ablation.     Physical Exam:BP 120/80 (BP Location: Left Arm, Patient Position: Sitting, Cuff Size: Small)   Pulse 92   Ht 5' 2"  (1.575 m)   Wt 170 lb (77.1 kg)   LMP 04/18/2017   BMI 31.09 kg/m  General:  Well developed, well nourished, no acute distress Skin:  Warm and dry Neck:  Midline trachea, normal thyroid, good ROM, no lymphadenopathy Lungs; Clear to auscultation bilaterally Breast:  No dominant palpable mass, retraction, or nipple discharge Cardiovascular: Regular rate and rhythm Abdomen:  Soft, non tender, no hepatosplenomegaly Pelvic:  External genitalia is normal in appearance, no lesions.  The vagina is normal in appearance. Urethra has no lesions or masses. The cervix is bulbous.  Uterus is felt to be normal size, shape, and contour.  No adnexal masses or tenderness noted.Bladder is non tender, no masses felt. Rectal: Good sphincter tone, no polyps, + hemorrhoids felt.  Hemoccult negative. Extremities/musculoskeletal:  No swelling or varicosities noted, no clubbing or cyanosis Psych:  No mood changes, alert and cooperative,seems happy PHQ 2 score 0.  Impression: 1. Well woman exam with routine gynecological exam   2. Screening  for colorectal cancer   3. Hemorrhoids, unspecified hemorrhoid type       Plan: Take vitamin D more regularly Pap and physical in 1 year Mammogram yearly  Call if spotting increases or is regular,can get [place] to assess

## 2017-08-13 ENCOUNTER — Other Ambulatory Visit: Payer: Self-pay | Admitting: Adult Health

## 2017-08-13 DIAGNOSIS — Z1231 Encounter for screening mammogram for malignant neoplasm of breast: Secondary | ICD-10-CM

## 2017-08-14 ENCOUNTER — Ambulatory Visit
Admission: RE | Admit: 2017-08-14 | Discharge: 2017-08-14 | Disposition: A | Discharge status: Home / Self Care | Payer: 59 | Source: Ambulatory Visit | Attending: Adult Health | Admitting: Adult Health

## 2017-08-14 DIAGNOSIS — Z1231 Encounter for screening mammogram for malignant neoplasm of breast: Secondary | ICD-10-CM | POA: Diagnosis not present

## 2018-01-21 ENCOUNTER — Telehealth: Payer: 59 | Admitting: Family Medicine

## 2018-01-21 DIAGNOSIS — J019 Acute sinusitis, unspecified: Secondary | ICD-10-CM | POA: Diagnosis not present

## 2018-01-21 MED ORDER — AMOXICILLIN-POT CLAVULANATE 875-125 MG PO TABS: 1.0000 | ORAL_TABLET | Freq: Two times a day (BID) | ORAL | 0 refills | Status: DC

## 2018-01-21 NOTE — Progress Notes (Signed)

## 2018-04-13 ENCOUNTER — Other Ambulatory Visit: Payer: 59 | Admitting: Adult Health

## 2018-05-05 ENCOUNTER — Other Ambulatory Visit (HOSPITAL_COMMUNITY)
Admission: RE | Admit: 2018-05-05 | Discharge: 2018-05-05 | Disposition: A | Discharge status: Home / Self Care | Payer: 59 | Source: Ambulatory Visit | Attending: Adult Health | Admitting: Adult Health

## 2018-05-05 ENCOUNTER — Other Ambulatory Visit: Payer: Self-pay

## 2018-05-05 ENCOUNTER — Ambulatory Visit (INDEPENDENT_AMBULATORY_CARE_PROVIDER_SITE_OTHER): Payer: 59 | Admitting: Adult Health

## 2018-05-05 ENCOUNTER — Encounter: Payer: Self-pay | Admitting: Adult Health

## 2018-05-05 VITALS — BP 122/74 | HR 87 | Ht 62.0 in | Wt 157.8 lb

## 2018-05-05 DIAGNOSIS — Z1211 Encounter for screening for malignant neoplasm of colon: Secondary | ICD-10-CM

## 2018-05-05 DIAGNOSIS — Z01419 Encounter for gynecological examination (general) (routine) without abnormal findings: Secondary | ICD-10-CM | POA: Insufficient documentation

## 2018-05-05 DIAGNOSIS — Z1321 Encounter for screening for nutritional disorder: Secondary | ICD-10-CM

## 2018-05-05 DIAGNOSIS — Z1212 Encounter for screening for malignant neoplasm of rectum: Secondary | ICD-10-CM | POA: Diagnosis not present

## 2018-05-05 DIAGNOSIS — Z131 Encounter for screening for diabetes mellitus: Secondary | ICD-10-CM | POA: Diagnosis not present

## 2018-05-05 DIAGNOSIS — Z13220 Encounter for screening for lipoid disorders: Secondary | ICD-10-CM

## 2018-05-05 LAB — HEMOCCULT GUIAC POC 1CARD (OFFICE): FECAL OCCULT BLD: NEGATIVE

## 2018-05-05 NOTE — Progress Notes (Signed)
Patient ID: Victoria Garrison, female   DOB: [birth year], 48 y.o.   MRN: [id number] History of Present Illness: Victoria Garrison is a 48 year old white female, married, in for well woman gyn exam and pap.She is Glass blower/designer in [employer].  PCP is Dr Buelah Manis.    Current Medications, Allergies, Past Medical History, Past Surgical History, Family History and Social History were reviewed in [facility] record.     Review of Systems: Patient denies any headaches, hearing loss, fatigue, blurred vision, shortness of breath, chest pain, abdominal pain, problems with bowel movements, urination, or intercourse. No joint pain or mood swings. She is sp ablation but will spot occasionally, denies any hot flashes yet   Physical Exam:BP 122/74 (BP Location: Right Arm, Patient Position: Sitting, Cuff Size: Normal)   Pulse 87   Ht 5' 2"  (1.575 m)   Wt 157 lb 12.8 oz (71.6 kg)   BMI 28.86 kg/m  General:  Well developed, well nourished, no acute distress Skin:  Warm and dry Neck:  Midline trachea, normal thyroid, good ROM, no lymphadenopathy Lungs; Clear to auscultation bilaterally Breast:  No dominant palpable mass, retraction, or nipple discharge Cardiovascular: Regular rate and rhythm Abdomen:  Soft, non tender, no hepatosplenomegaly Pelvic:  External genitalia is normal in appearance, no lesions.  The vagina is normal in appearance. Urethra has no lesions or masses. The cervix is smooth, pap with HPV performed.  Uterus is felt to be normal size, shape, and contour.  No adnexal masses or tenderness noted.Bladder is non tender, no masses felt. Rectal: Good sphincter tone, no polyps, or hemorrhoids felt.  Hemoccult negative. Extremities/musculoskeletal:  No swelling or varicosities noted, no clubbing or cyanosis Psych:  No mood changes, alert and cooperative,seems happy Fall risk is low. PHQ 2 score is 0. Examination chaperoned by Levy Pupa LPN  Impression: 1. Encounter for  gynecological examination with Papanicolaou smear of cervix   2. Screening for colorectal cancer   3. Screening for diabetes mellitus   4. Screening cholesterol level   5. Encounter for vitamin deficiency screening       Plan:  Check CBC,CMP,TSH and lipids,A1c and vitamin D Mammogram in May and yearly Physical in 1 year Pap in 3 if normal Colonoscopy at 74

## 2018-05-06 LAB — HEMOGLOBIN A1C
Est. average glucose Bld gHb Est-mCnc: 97 mg/dL
HEMOGLOBIN A1C: 5 % (ref 4.8–5.6)

## 2018-05-06 LAB — COMPREHENSIVE METABOLIC PANEL
ALBUMIN: 4.5 g/dL (ref 3.8–4.8)
ALT: 37 IU/L — AB (ref 0–32)
AST: 26 IU/L (ref 0–40)
Albumin/Globulin Ratio: 2 (ref 1.2–2.2)
Alkaline Phosphatase: 72 IU/L (ref 39–117)
BUN / CREAT RATIO: 11 (ref 9–23)
BUN: 8 mg/dL (ref 6–24)
Bilirubin Total: 0.7 mg/dL (ref 0.0–1.2)
CALCIUM: 9.3 mg/dL (ref 8.7–10.2)
CHLORIDE: 102 mmol/L (ref 96–106)
CO2: 23 mmol/L (ref 20–29)
Creatinine, Ser: 0.7 mg/dL (ref 0.57–1.00)
GFR, EST AFRICAN AMERICAN: 120 mL/min/{1.73_m2} (ref >59)
GFR, EST NON AFRICAN AMERICAN: 104 mL/min/{1.73_m2} (ref >59)
GLUCOSE: 89 mg/dL (ref 65–99)
Globulin, Total: 2.3 g/dL (ref 1.5–4.5)
Potassium: 4.4 mmol/L (ref 3.5–5.2)
Sodium: 140 mmol/L (ref 134–144)
TOTAL PROTEIN: 6.8 g/dL (ref 6.0–8.5)

## 2018-05-06 LAB — CBC
Hematocrit: 42.8 % (ref 34.0–46.6)
Hemoglobin: 15 g/dL (ref 11.1–15.9)
MCH: 30.2 pg (ref 26.6–33.0)
MCHC: 35 g/dL (ref 31.5–35.7)
MCV: 86 fL (ref 79–97)
PLATELETS: 378 10*3/uL (ref 150–450)
RBC: 4.97 x10E6/uL (ref 3.77–5.28)
RDW: 12.5 % (ref 11.7–15.4)
WBC: 7.1 10*3/uL (ref 3.4–10.8)

## 2018-05-06 LAB — LIPID PANEL
CHOL/HDL RATIO: 3.5 ratio (ref 0.0–4.4)
Cholesterol, Total: 197 mg/dL (ref 100–199)
HDL: 56 mg/dL (ref >39)
LDL CALC: 124 mg/dL — AB (ref 0–99)
TRIGLYCERIDES: 83 mg/dL (ref 0–149)
VLDL CHOLESTEROL CAL: 17 mg/dL (ref 5–40)

## 2018-05-06 LAB — VITAMIN D 25 HYDROXY (VIT D DEFICIENCY, FRACTURES): Vit D, 25-Hydroxy: 35.4 ng/mL (ref 30.0–100.0)

## 2018-05-06 LAB — TSH: TSH: 1.72 u[IU]/mL (ref 0.450–4.500)

## 2018-05-10 LAB — CYTOLOGY - PAP
Adequacy: ABSENT
Diagnosis: NEGATIVE
HPV (WINDOPATH): NOT DETECTED

## 2018-06-04 ENCOUNTER — Telehealth: Payer: 59 | Admitting: Family Medicine

## 2018-06-23 ENCOUNTER — Other Ambulatory Visit: Payer: Self-pay

## 2018-06-23 ENCOUNTER — Ambulatory Visit: Payer: 59 | Admitting: Family Medicine

## 2018-06-30 ENCOUNTER — Ambulatory Visit: Payer: 59 | Admitting: Family Medicine

## 2018-06-30 ENCOUNTER — Other Ambulatory Visit: Payer: Self-pay

## 2018-08-06 ENCOUNTER — Other Ambulatory Visit: Payer: Self-pay | Admitting: Adult Health

## 2018-08-06 ENCOUNTER — Other Ambulatory Visit: Payer: Self-pay | Admitting: Gastroenterology

## 2018-08-06 DIAGNOSIS — Z1231 Encounter for screening mammogram for malignant neoplasm of breast: Secondary | ICD-10-CM

## 2018-08-16 DIAGNOSIS — Z1231 Encounter for screening mammogram for malignant neoplasm of breast: Principal | ICD-10-CM

## 2018-10-13 ENCOUNTER — Other Ambulatory Visit: Payer: Self-pay

## 2018-10-13 ENCOUNTER — Ambulatory Visit
Admission: RE | Admit: 2018-10-13 | Discharge: 2018-10-13 | Disposition: A | Discharge status: Home / Self Care | Payer: 59 | Source: Ambulatory Visit | Attending: Adult Health | Admitting: Adult Health

## 2018-10-13 DIAGNOSIS — Z1231 Encounter for screening mammogram for malignant neoplasm of breast: Secondary | ICD-10-CM | POA: Diagnosis not present

## 2019-09-12 ENCOUNTER — Other Ambulatory Visit: Payer: Self-pay | Admitting: Adult Health

## 2019-09-12 DIAGNOSIS — Z1231 Encounter for screening mammogram for malignant neoplasm of breast: Secondary | ICD-10-CM

## 2019-10-14 ENCOUNTER — Ambulatory Visit: Payer: 59

## 2019-10-19 ENCOUNTER — Ambulatory Visit: Payer: 59

## 2019-10-19 ENCOUNTER — Ambulatory Visit
Admission: RE | Admit: 2019-10-19 | Discharge: 2019-10-19 | Disposition: A | Discharge status: Home / Self Care | Payer: 59 | Source: Ambulatory Visit | Attending: Adult Health | Admitting: Adult Health

## 2019-10-19 ENCOUNTER — Other Ambulatory Visit: Payer: Self-pay

## 2019-10-19 DIAGNOSIS — Z1231 Encounter for screening mammogram for malignant neoplasm of breast: Secondary | ICD-10-CM

## 2020-08-01 ENCOUNTER — Other Ambulatory Visit: Payer: Self-pay | Admitting: Adult Health

## 2020-08-01 DIAGNOSIS — Z1231 Encounter for screening mammogram for malignant neoplasm of breast: Secondary | ICD-10-CM

## 2020-08-23 ENCOUNTER — Other Ambulatory Visit: Payer: 59 | Admitting: Adult Health

## 2020-08-31 ENCOUNTER — Ambulatory Visit (HOSPITAL_BASED_OUTPATIENT_CLINIC_OR_DEPARTMENT_OTHER): Payer: 59 | Admitting: Nurse Practitioner

## 2020-09-10 ENCOUNTER — Other Ambulatory Visit: Payer: Self-pay

## 2020-09-10 ENCOUNTER — Ambulatory Visit (INDEPENDENT_AMBULATORY_CARE_PROVIDER_SITE_OTHER): Payer: 59 | Admitting: Nurse Practitioner

## 2020-09-10 ENCOUNTER — Encounter (HOSPITAL_BASED_OUTPATIENT_CLINIC_OR_DEPARTMENT_OTHER): Payer: Self-pay | Admitting: Nurse Practitioner

## 2020-09-10 VITALS — BP 139/73 | HR 79 | Ht 62.0 in | Wt 172.4 lb

## 2020-09-10 DIAGNOSIS — Z131 Encounter for screening for diabetes mellitus: Secondary | ICD-10-CM

## 2020-09-10 DIAGNOSIS — K529 Noninfective gastroenteritis and colitis, unspecified: Secondary | ICD-10-CM

## 2020-09-10 DIAGNOSIS — Z13228 Encounter for screening for other metabolic disorders: Secondary | ICD-10-CM

## 2020-09-10 DIAGNOSIS — Z130 Encounter for screening for diseases of the blood and blood-forming organs and certain disorders involving the immune mechanism: Secondary | ICD-10-CM | POA: Diagnosis not present

## 2020-09-10 DIAGNOSIS — Z0000 Encounter for general adult medical examination without abnormal findings: Secondary | ICD-10-CM | POA: Diagnosis not present

## 2020-09-10 DIAGNOSIS — Z1329 Encounter for screening for other suspected endocrine disorder: Secondary | ICD-10-CM

## 2020-09-10 DIAGNOSIS — Z1321 Encounter for screening for nutritional disorder: Secondary | ICD-10-CM

## 2020-09-10 DIAGNOSIS — Z1211 Encounter for screening for malignant neoplasm of colon: Secondary | ICD-10-CM

## 2020-09-10 DIAGNOSIS — Z13220 Encounter for screening for lipoid disorders: Secondary | ICD-10-CM | POA: Diagnosis not present

## 2020-09-10 LAB — POCT GLYCOSYLATED HEMOGLOBIN (HGB A1C): HbA1c POC (<> result, manual entry): 4.1 % (ref 4.0–5.6)

## 2020-09-10 NOTE — Progress Notes (Deleted)
e

## 2020-09-10 NOTE — Patient Instructions (Signed)
Recommendations from today's visit: I have included information on the FodMap diet for you to review. If your symptoms get any worse, please let me know and we can investigate further.  If you have any needs, please feel free to reach out. We will contact you about your labs either through [place] (if everything is normal) or through a phone call if we need to discuss anything.   Information on diet, exercise, and health maintenance recommendations are listed below. This is information to help you be sure you are on track for optimal health and monitoring.   Please look over this and let us know if you have any questions or if you have completed any of the health maintenance outside of [facility] so that we can be sure your records are up to date.  ___________________________________________________________  Thank you for choosing [place] at [facility] for your Primary Care needs. I am excited for the opportunity to partner with you to meet your health care goals. It was a pleasure meeting you today!  I am an Adult-Geriatric Nurse Practitioner with a background in caring for patients for more than 20 years. I received my Paediatric nurse in Nursing and my Doctor of Nursing Practice degrees at [employer]. I received additional fellowship training in primary care and sports medicine after receiving my doctorate degree. I provide primary care and sports medicine services to patients age 36 and older within this office. I am also a provider with the [facility] and the director of the APP Fellowship with [facility].  I am a [place] native, but have called the [place] area home for nearly 20 years and am proud to be a member of this community.   I am passionate about providing the best service to you through preventive medicine and supportive care. I consider you a part of the medical team and value your input. I work diligently to  ensure that you are heard and your needs are met in a safe and effective manner. I want you to feel comfortable with me as your provider and want you to know that your health concerns are important to me.   For your information, our office hours are Monday- Friday 8:00 AM - 5:00 PM At this time I am not in the office on Wednesdays.  If you have questions or concerns, please call our office at [phone] or send [place] a MyChart message and we will respond as quickly as possible.   For all urgent or time sensitive needs we ask that you please call the office to avoid delays. MyChart is not constantly monitored and replies may take up to 72 business hours.  MyChart Policy: MyChart allows for you to see your visit notes, after visit summary, provider recommendations, lab and tests results, make an appointment, request refills, and contact your provider or the office for non-urgent questions or concerns.  Providers are seeing patients during normal business hours and do not have built in time to review MyChart messages. We ask that you allow a minimum of 72 business hours for MyChart message responses.  Complex MyChart concerns may require a visit. Your provider may request you schedule a virtual or in person visit to ensure we are providing the best care possible. MyChart messages sent after 4:00 PM on Friday will not be received by the provider until Monday morning.    Lab and Test Results: You will receive your lab and test results  on MyChart as soon as they are completed and results have been sent by the lab or testing facility. Due to this service, you will receive your results BEFORE your provider.  Please allow a minimum of 72 business hours for your provider to receive and review lab and test results and contact you about.   Most lab and test result comments from the provider will be sent through [place]. Your provider may recommend changes to the plan of care, follow-up visits, repeat testing,  ask questions, or request an office visit to discuss these results. You may reply directly to this message or call the office at [phone] to provide information for the provider or set up an appointment. In some instances, you will be called with test results and recommendations. Please let us know if this is preferred and we will make note of this in your chart to provide this for you.    If you have not heard a response to your lab or test results in 72 business hours, please call the office to let us know.   After Hours: For all non-emergency after hours needs, please call the office at [phone] and select the option to reach the on-call provider service. On-call services are shared between multiple [place] offices and therefore it will not be possible to speak directly with your provider. On-call providers may provide medical advice and recommendations, but are unable to provide refills for maintenance medications.  For all emergency or urgent medical needs after normal business hours, we recommend that you seek care at the closest Urgent Care or Emergency Department to ensure appropriate treatment in a timely manner.  [facility] [place] has a 24 hour emergency room located on the ground floor for your convenience.    Please do not hesitate to reach out to [place] with concerns.   Thank you, again, for choosing me as your health care partner. I appreciate your trust and look forward to learning more about you.   [name], DNP, AGNP-c ___________________________________________________________  Health Maintenance Recommendations Screening Testing Mammogram Every 1 -2 years based on history and risk factors Starting at age 11 Pap Smear Ages 21-39 every 3 years Ages 50-65 every 5 years with HPV testing More frequent testing may be required based on results and history Colon Cancer Screening Every 1-10 years based on test performed, risk factors, and  history Starting at age 57 Bone Density Screening Every 2-10 years based on history Starting at age 41 for women Recommendations for men differ based on medication usage, history, and risk factors AAA Screening One time ultrasound Men 72-27 years old who have every smoked Lung Cancer Screening Low Dose Lung CT every 12 months Age 43-80 years with a 30 pack-year smoking history who still smoke or who have quit within the last 15 years  Screening Labs Routine  Labs: Complete Blood Count (CBC), Complete Metabolic Panel (CMP), Cholesterol (Lipid Panel) Every 6-12 months based on history and medications May be recommended more frequently based on current conditions or previous results Hemoglobin A1c Lab Every 3-12 months based on history and previous results Starting at age 42 or earlier with diagnosis of diabetes, high cholesterol, BMI >26, and/or risk factors Frequent monitoring for patients with diabetes to ensure blood sugar control Thyroid Panel (TSH w/ T3 & T4) Every 6 months based on history, symptoms, and risk factors May be repeated more often if on medication HIV One time testing for all patients 69 and older May be repeated  more frequently for patients with increased risk factors or exposure Hepatitis C One time testing for all patients 18 and older May be repeated more frequently for patients with increased risk factors or exposure Gonorrhea, Chlamydia Every 12 months for all sexually active persons 13-24 years Additional monitoring may be recommended for those who are considered high risk or who have symptoms PSA Men 65-7 years old with risk factors Additional screening may be recommended from age 61-69 based on risk factors, symptoms, and history  Vaccine Recommendations Tetanus Booster All adults every 10 years Flu Vaccine All patients 6 months and older every year COVID Vaccine All patients 12 years and older Initial dosing with booster May recommend  additional booster based on age and health history HPV Vaccine 2 doses all patients age 43-26 Dosing may be considered for patients over 26 Shingles Vaccine (Shingrix) 2 doses all adults 22 years and older Pneumonia (Pneumovax 23) All adults 53 years and older May recommend earlier dosing based on health history Pneumonia (Prevnar 39) All adults 47 years and older Dosed 1 year after Pneumovax 23  Additional Screening, Testing, and Vaccinations may be recommended on an individualized basis based on family history, health history, risk factors, and/or exposure.  __________________________________________________________  Diet Recommendations for All Patients  I recommend that all patients maintain a diet low in saturated fats, carbohydrates, and cholesterol. While this can be challenging at first, it is not impossible and small changes can make big differences.  Things to try: Decreasing the amount of soda, sweet tea, and/or juice to one or less per day and replace with water While water is always the first choice, if you do not like water you may consider adding a water additive without sugar to improve the taste other sugar free drinks Replace potatoes with a brightly colored vegetable at dinner Use healthy oils, such as canola oil or olive oil, instead of butter or hard margarine Limit your bread intake to two pieces or less a day Replace regular pasta with low carb pasta options Bake, broil, or grill foods instead of frying Monitor portion sizes  Eat smaller, more frequent meals throughout the day instead of large meals  An important thing to remember is, if you love foods that are not great for your health, you don't have to give them up completely. Instead, allow these foods to be a reward when you have done well. Allowing yourself to still have special treats every once in a while is a nice way to tell yourself thank you for working hard to keep yourself healthy.   Also remember  that every day is a new day. If you have a bad day and "fall off the wagon", you can still climb right back up and keep moving along on your journey!  We have resources available to help you!  Some websites that may be helpful include: www.http://carter.biz/  Www.VeryWellFit.com _____________________________________________________________  Activity Recommendations for All Patients  I recommend that all adults get at least 20 minutes of moderate physical activity that elevates your heart rate at least 5 days out of the week.  Some examples include: Walking or jogging at a pace that allows you to carry on a conversation Cycling (stationary bike or outdoors) Water aerobics Yoga Weight lifting Dancing If physical limitations prevent you from putting stress on your joints, exercise in a pool or seated in a chair are excellent options.  Do determine your MAXIMUM heart rate for activity: YOUR AGE - 220 = MAX HeartRate  Remember! Do not push yourself too hard.  Start slowly and build up your pace, speed, weight, time in exercise, etc.  Allow your body to rest between exercise and get good sleep. You will need more water than normal when you are exerting yourself. Do not wait until you are thirsty to drink. Drink with a purpose of getting in at least 8, 8 ounce glasses of water a day plus more depending on how much you exercise and sweat.    If you begin to develop dizziness, chest pain, abdominal pain, jaw pain, shortness of breath, headache, vision changes, lightheadedness, or other concerning symptoms, stop the activity and allow your body to rest. If your symptoms are severe, seek emergency evaluation immediately. If your symptoms are concerning, but not severe, please let us know so that we can recommend further evaluation.   ________________________________________________________________  

## 2020-09-10 NOTE — Assessment & Plan Note (Signed)
Symptoms appear to be consistent with IBS-D or possible food sensitivity. No alarm symptoms present today.  Recent incontinent appears transient without any concerning factors present.  Recommend pelvic floor exercises to help with increased tone in the pelvic floor.  Recommend monitoring for foods and stressors that seem to make symptoms worse. Information provided on FodMap diet and IBS.  If symptoms worsen or fail to improve, recommend follow-up. Will monitor for time being.

## 2020-09-10 NOTE — Progress Notes (Signed)
[name], DNP, AGNP-c Primary Care Services ______________________________________________________________________  HPI Victoria Garrison is a 50 y.o. female presenting to [facility] [place] today to establish care.   Patient Care Team: Slayde Brault, Coralee Pesa, NP as PCP - General (Nurse Practitioner) Claris Gower- NP (OB GYN)  She reports she is overall healthy. 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 She is married and has 3 children.   Health Maintenance  Topic Date Due   COVID-19 Vaccine (1) Never done   HIV Screening  Never done   Hepatitis C Screening: USPSTF Recommendation to screen - Ages 84-79 yo.  Never done   Tetanus Vaccine  11/08/2012   Colon Cancer Screening  Never done   Flu Shot  10/15/2020   Mammogram  10/18/2020   Pap Smear  05/05/2021   Pneumococcal Vaccination  Aged Out   HPV Vaccine  Aged Out   Concerns today: Loose stools She reports chronic loose stools with no other concerning symptoms.  Recently she did experience a single episode of stool incontinence that she thought was gas.  She does report that certain foods can make her symptoms worse and she has worked to avoid these food triggers, which helps She denies abdominal pain, fevers, chills, nausea, blood in stool, weight loss, dehydration, or difficult to pass stool.  She has no low back back or weakness in the LE's.  She has no urinary incontinence.   Patient Active Problem List   Diagnosis Date Noted   Encounter for medical examination to establish care 09/10/2020   Chronic diarrhea 09/10/2020   Encounter for gynecological examination with Papanicolaou smear of cervix 05/05/2018   Hemorrhoids 05/07/2016   Screening for colorectal cancer 05/07/2016   Well woman exam with routine gynecological exam 05/07/2016   PFS (patellofemoral syndrome) 01/31/2014   Weight loss counseling, encounter  for 04/01/2013   Fibroids 09/02/2012   Bronchitis 04/21/2012    PHQ9 Today: Depression screen [facility] 2/9 09/10/2020 05/05/2018 05/12/2017  Decreased Interest 0 0 0  Down, Depressed, Hopeless 0 0 0  PHQ - 2 Score 0 0 0  Altered sleeping 0 - -  Tired, decreased energy 0 - -  Change in appetite 0 - -  Feeling bad or failure about yourself  0 - -  Trouble concentrating 0 - -  Moving slowly or fidgety/restless 0 - -  Suicidal thoughts 0 - -  PHQ-9 Score 0 - -   GAD7 Today: GAD 7 : Generalized Anxiety Score 09/10/2020  Nervous, Anxious, on Edge 0  Control/stop worrying 0  Worry too much - different things 0  Trouble relaxing 1  Restless 0  Easily annoyed or irritable 0  Afraid - awful might happen 0  Total GAD 7 Score 1   ______________________________________________________________________ PMH Past Medical History:  Diagnosis Date   Abnormal Pap smear    Anemia    Due to heavy menustral cycles   Dysmenorrhea    Fibroadenoma of left breast    Fibroids 09/02/2012   HSV-1 (herpes simplex virus 1) infection    Menorrhagia    Vaginal Pap smear, abnormal     ROS All review of systems negative except what is listed in the HPI  PHYSICAL EXAM Physical Exam Vitals and nursing note reviewed.  Constitutional:      Appearance: Normal appearance.  HENT:     Head: Normocephalic and atraumatic.     Right Ear: Tympanic  membrane, ear canal and external ear normal.     Left Ear: Tympanic membrane, ear canal and external ear normal.     Nose: Nose normal.     Mouth/Throat:     Mouth: Mucous membranes are moist.     Pharynx: Oropharynx is clear.  Eyes:     Extraocular Movements: Extraocular movements intact.     Conjunctiva/sclera: Conjunctivae normal.     Pupils: Pupils are equal, round, and reactive to light.  Cardiovascular:     Rate and Rhythm: Normal rate and regular rhythm.     Pulses: Normal pulses.     Heart sounds: Normal heart sounds.  Pulmonary:     Effort: Pulmonary  effort is normal.     Breath sounds: Normal breath sounds.  Abdominal:     General: Abdomen is flat. Bowel sounds are normal. There is no distension.     Palpations: Abdomen is soft.     Tenderness: There is no abdominal tenderness. There is no right CVA tenderness, left CVA tenderness, guarding or rebound.  Musculoskeletal:        General: Normal range of motion.     Cervical back: Normal range of motion and neck supple. No tenderness.     Right lower leg: No edema.     Left lower leg: No edema.  Lymphadenopathy:     Cervical: No cervical adenopathy.  Skin:    General: Skin is warm and dry.     Capillary Refill: Capillary refill takes less than 2 seconds.  Neurological:     General: No focal deficit present.     Mental Status: She is alert and oriented to person, place, and time.     Cranial Nerves: No cranial nerve deficit.     Sensory: No sensory deficit.     Motor: No weakness.     Coordination: Coordination normal.     Gait: Gait normal.  Psychiatric:        Mood and Affect: Mood normal.        Behavior: Behavior normal.        Thought Content: Thought content normal.        Judgment: Judgment normal.   ______________________________________________________________________ ASSESSMENT AND PLAN Problem List Items Addressed This Visit     Encounter for medical examination to establish care - Primary    Review of current and past medical history, social history, medication, and family history.  Review of care gaps and health maintenance recommendations.  Records from recent providers to be requested if not available in Chart Review or Care Everywhere.  Recommendations for health maintenance, diet, and exercise provided.  Physical exam today with no concerning findings.  Labs today.  Order for Cologaurd placed.  Will make changes to plan of care as necessary based on lab results.  F/U in 1 year or sooner if needed.         Chronic diarrhea    Symptoms appear to be  consistent with IBS-D or possible food sensitivity. No alarm symptoms present today.  Recent incontinent appears transient without any concerning factors present.  Recommend pelvic floor exercises to help with increased tone in the pelvic floor.  Recommend monitoring for foods and stressors that seem to make symptoms worse. Information provided on FodMap diet and IBS.  If symptoms worsen or fail to improve, recommend follow-up. Will monitor for time being.         Relevant Medications   Nutritional Supplements (LIPOTROPIC COMPLEX PO)   Other Relevant Orders  CBC with Differential/Platelet   Comprehensive metabolic panel   Lipid panel   POCT glycosylated hemoglobin (Hb A1C)   TSH   Other Visit Diagnoses     Colon cancer screening       Relevant Orders   Cologuard   Laboratory tests ordered as part of a complete physical exam (CPE)       Relevant Orders   CBC with Differential/Platelet   Comprehensive metabolic panel   Lipid panel   POCT glycosylated hemoglobin (Hb A1C)   TSH       Education provided today during visit and on AVS for patient to review at home.  Diet and Exercise recommendations provided.  Current diagnoses and recommendations discussed. HM recommendations reviewed with recommendations.    Outpatient Encounter Medications as of 09/10/2020  Medication Sig   Nutritional Supplements (LIPOTROPIC COMPLEX PO) Take by mouth.   No facility-administered encounter medications on file as of 09/10/2020.    Return in about 1 year (around 09/10/2021) for CPE- CPE today.  Time: 55 minutes, >50% spent counseling, care coordination, chart review, and documentation.   Orma Render, DNP, AGNP-c

## 2020-09-10 NOTE — Assessment & Plan Note (Signed)
Review of current and past medical history, social history, medication, and family history.  Review of care gaps and health maintenance recommendations.  Records from recent providers to be requested if not available in Chart Review or Care Everywhere.  Recommendations for health maintenance, diet, and exercise provided.  Physical exam today with no concerning findings.  Labs today.  Order for Cologaurd placed.  Will make changes to plan of care as necessary based on lab results.  F/U in 1 year or sooner if needed.

## 2020-09-11 LAB — CBC WITH DIFFERENTIAL/PLATELET
Basophils Absolute: 0.1 10*3/uL (ref 0.0–0.2)
Basos: 1 %
EOS (ABSOLUTE): 0.1 10*3/uL (ref 0.0–0.4)
Eos: 2 %
Hematocrit: 41.3 % (ref 34.0–46.6)
Hemoglobin: 14 g/dL (ref 11.1–15.9)
Immature Grans (Abs): 0 10*3/uL (ref 0.0–0.1)
Immature Granulocytes: 0 %
Lymphocytes Absolute: 2 10*3/uL (ref 0.7–3.1)
Lymphs: 23 %
MCH: 30 pg (ref 26.6–33.0)
MCHC: 33.9 g/dL (ref 31.5–35.7)
MCV: 89 fL (ref 79–97)
Monocytes Absolute: 0.6 10*3/uL (ref 0.1–0.9)
Monocytes: 7 %
Neutrophils Absolute: 6.1 10*3/uL (ref 1.4–7.0)
Neutrophils: 67 %
Platelets: 322 10*3/uL (ref 150–450)
RBC: 4.66 x10E6/uL (ref 3.77–5.28)
RDW: 12.5 % (ref 11.7–15.4)
WBC: 9 10*3/uL (ref 3.4–10.8)

## 2020-09-11 LAB — COMPREHENSIVE METABOLIC PANEL
ALT: 20 IU/L (ref 0–32)
AST: 18 IU/L (ref 0–40)
Albumin/Globulin Ratio: 2.4 — ABNORMAL HIGH (ref 1.2–2.2)
Albumin: 4.8 g/dL (ref 3.8–4.8)
Alkaline Phosphatase: 70 IU/L (ref 44–121)
BUN/Creatinine Ratio: 7 — ABNORMAL LOW (ref 9–23)
BUN: 5 mg/dL — ABNORMAL LOW (ref 6–24)
Bilirubin Total: 0.5 mg/dL (ref 0.0–1.2)
CO2: 24 mmol/L (ref 20–29)
Calcium: 9.1 mg/dL (ref 8.7–10.2)
Chloride: 102 mmol/L (ref 96–106)
Creatinine, Ser: 0.68 mg/dL (ref 0.57–1.00)
Globulin, Total: 2 g/dL (ref 1.5–4.5)
Glucose: 94 mg/dL (ref 65–99)
Potassium: 4.4 mmol/L (ref 3.5–5.2)
Sodium: 141 mmol/L (ref 134–144)
Total Protein: 6.8 g/dL (ref 6.0–8.5)
eGFR: 107 mL/min/{1.73_m2} (ref >59)

## 2020-09-11 LAB — LIPID PANEL
Chol/HDL Ratio: 3.7 ratio (ref 0.0–4.4)
Cholesterol, Total: 184 mg/dL (ref 100–199)
HDL: 50 mg/dL (ref >39)
LDL Chol Calc (NIH): 117 mg/dL — ABNORMAL HIGH (ref 0–99)
Triglycerides: 93 mg/dL (ref 0–149)
VLDL Cholesterol Cal: 17 mg/dL (ref 5–40)

## 2020-09-11 LAB — TSH: TSH: 2 u[IU]/mL (ref 0.450–4.500)

## 2020-09-24 NOTE — Progress Notes (Signed)
Overall labs look good.   LDL slightly elevated, but HDL normal.  Recommend monitor saturated fats and increase fiber in diet.  Walk 20 minutes daily.

## 2020-10-19 ENCOUNTER — Other Ambulatory Visit: Payer: Self-pay

## 2020-10-19 ENCOUNTER — Ambulatory Visit
Admission: RE | Admit: 2020-10-19 | Discharge: 2020-10-19 | Disposition: A | Discharge status: Home / Self Care | Payer: 59 | Source: Ambulatory Visit

## 2020-10-19 DIAGNOSIS — Z1231 Encounter for screening mammogram for malignant neoplasm of breast: Secondary | ICD-10-CM | POA: Diagnosis not present

## 2021-05-07 ENCOUNTER — Ambulatory Visit (INDEPENDENT_AMBULATORY_CARE_PROVIDER_SITE_OTHER): Payer: 59 | Admitting: Adult Health

## 2021-05-07 ENCOUNTER — Other Ambulatory Visit: Payer: Self-pay

## 2021-05-07 ENCOUNTER — Other Ambulatory Visit (HOSPITAL_COMMUNITY)
Admission: RE | Admit: 2021-05-07 | Discharge: 2021-05-07 | Disposition: A | Discharge status: Home / Self Care | Payer: 59 | Source: Ambulatory Visit | Attending: Adult Health | Admitting: Adult Health

## 2021-05-07 ENCOUNTER — Encounter: Payer: Self-pay | Admitting: Adult Health

## 2021-05-07 VITALS — BP 138/85 | HR 77 | Ht 62.0 in | Wt 144.0 lb

## 2021-05-07 DIAGNOSIS — Z1212 Encounter for screening for malignant neoplasm of rectum: Secondary | ICD-10-CM

## 2021-05-07 DIAGNOSIS — Z1211 Encounter for screening for malignant neoplasm of colon: Secondary | ICD-10-CM | POA: Insufficient documentation

## 2021-05-07 DIAGNOSIS — Z01419 Encounter for gynecological examination (general) (routine) without abnormal findings: Secondary | ICD-10-CM | POA: Insufficient documentation

## 2021-05-07 DIAGNOSIS — Z0000 Encounter for general adult medical examination without abnormal findings: Secondary | ICD-10-CM | POA: Insufficient documentation

## 2021-05-07 DIAGNOSIS — D229 Melanocytic nevi, unspecified: Secondary | ICD-10-CM | POA: Insufficient documentation

## 2021-05-07 LAB — HEMOCCULT GUIAC POC 1CARD (OFFICE): Fecal Occult Blood, POC: NEGATIVE

## 2021-05-07 NOTE — Progress Notes (Signed)
Patient ID: Victoria Garrison, female   DOB: 10/04/1970, 51 y.o.   MRN: [id number] History of Present Illness: Victoria Garrison is a 51 year old white female,married, [id number], in for a well woman gyn exam and pap.  PCP is Victoria Reedy NP.  Lab Results  Component Value Date   DIAGPAP  05/05/2018    NEGATIVE FOR INTRAEPITHELIAL LESIONS OR MALIGNANCY.   HPV NOT DETECTED 05/05/2018    Current Medications, Allergies, Past Medical History, Past Surgical History, Family History and Social History were reviewed in [employer] [employer] record.     Review of Systems: Patient denies any headaches, hearing loss, fatigue, blurred vision, shortness of breath, chest pain, abdominal pain, problems with bowel movements, urination, or intercourse. No joint pain or mood swings.  Occasional spotting since ablation    Physical Exam:BP 138/85 (BP Location: Left Arm, Patient Position: Sitting, Cuff Size: Normal)    Pulse 77    Ht 5' 2"  (1.575 m)    Wt 144 lb (65.3 kg)    BMI 26.34 kg/m   General:  Well developed, well nourished, no acute distress Skin:  Warm and dry Neck:  Midline trachea, normal thyroid, good ROM, no lymphadenopathy Lungs; Clear to auscultation bilaterally Breast:  No dominant palpable mass, retraction, or nipple discharge, has irregular mole mid chest that has changed she says Cardiovascular: Regular rate and rhythm Abdomen:  Soft, non tender, no hepatosplenomegaly Pelvic:  External genitalia is normal in appearance, no lesions.  The vagina is normal in appearance. Urethra has no lesions or masses. The cervix is bulbous.Pap with HR HPV genotyping performed.  Uterus is felt to be normal size, shape, and contour.  No adnexal masses or tenderness noted.Bladder is non tender, no masses felt. Rectal: Good sphincter tone, no polyps, or hemorrhoids felt.  Hemoccult negative. Extremities/musculoskeletal:  No swelling or varicosities noted, no clubbing or cyanosis Psych:  No mood changes, alert and  cooperative,seems happy AA is 1 Depression screen [facility] 2/9 05/07/2021 09/10/2020 05/05/2018  Decreased Interest 0 0 0  Down, Depressed, Hopeless 0 0 0  PHQ - 2 Score 0 0 0  Altered sleeping 0 0 -  Tired, decreased energy 0 0 -  Change in appetite 0 0 -  Feeling bad or failure about yourself  0 0 -  Trouble concentrating 0 0 -  Moving slowly or fidgety/restless 0 0 -  Suicidal thoughts 0 0 -  PHQ-9 Score 0 0 -    GAD 7 : Generalized Anxiety Score 05/07/2021 09/10/2020  Nervous, Anxious, on Edge 0 0  Control/stop worrying 0 0  Worry too much - different things 0 0  Trouble relaxing 0 1  Restless 0 0  Easily annoyed or irritable 0 0  Afraid - awful might happen 0 0  Total GAD 7 Score 0 1    Upstream - 05/07/21 0830       Pregnancy Intention Screening   Does the patient want to become pregnant in the next year? No    Does the patient's partner want to become pregnant in the next year? No    Would the patient like to discuss contraceptive options today? No      Contraception Wrap Up   Current Method Vasectomy   ablation/vasectomy   End Method Vasectomy   ablation/vasectomy   Contraception Counseling Provided No              Examination chaperoned by Levy Pupa LPN   Impression and Plan: 1. Encounter for gynecological  examination with Papanicolaou smear of cervix Pap sent Physical in 1 year Pap in 3 if normal Labs with PCP Mammogram yearly   2. Encounter for screening fecal occult blood testing  3. Change in mole She will call dermatology for consult   4. Screening for colorectal cancer Cologuard ordered for screening

## 2021-05-10 LAB — CYTOLOGY - PAP
Adequacy: ABSENT
Comment: NEGATIVE
Diagnosis: NEGATIVE
High risk HPV: NEGATIVE

## 2021-05-29 DIAGNOSIS — Z1211 Encounter for screening for malignant neoplasm of colon: Secondary | ICD-10-CM | POA: Diagnosis not present

## 2021-05-29 DIAGNOSIS — Z1212 Encounter for screening for malignant neoplasm of rectum: Secondary | ICD-10-CM | POA: Diagnosis not present

## 2021-06-06 LAB — COLOGUARD: COLOGUARD: NEGATIVE

## 2021-06-11 ENCOUNTER — Telehealth: Payer: 59 | Admitting: Physician Assistant

## 2021-06-11 DIAGNOSIS — R42 Dizziness and giddiness: Secondary | ICD-10-CM

## 2021-06-11 NOTE — Progress Notes (Signed)
Based on what you shared with me, I feel your condition warrants further evaluation and I recommend that you be seen in a face to face visit.  For dizziness you need to be evaluated in person to get a good examination to see if this is related to fluid build-up behind the ears, sinus inflammation or anything more serious.  ?  ?NOTE: There will be NO CHARGE for this eVisit ?  ?If you are having a true medical emergency please call 911.   ?  ? For an urgent face to face visit, [facility] has six urgent care centers for your convenience:  ?  ? [facility] ?Get Driving Directions ?[phone] ?[address] ?[place], [place] [address] ?  ? [facility]) ?Get Driving Directions ?[phone] ?[address] ?[place], [place] [address] ? ?[facility]) ?Get Driving Directions ?[address] [place],  [place]  [address] ? ?[facility] [place] ?Get Driving Directions ?[phone] ?[address] ?[place], [place] [address] ?  ?[facility] [place] ?Get Driving Directions  ?[phone] ?[address] ?[address] ?[place], [place] [address] ?  ? Urgent Care at [facility] ?Get Driving Directions ?[phone] ?[address] ?[place], [place] [address] ? ?Your MyChart E-visit questionnaire answers were reviewed by a board certified advanced clinical practitioner to complete your personal care plan based on your specific symptoms.  Thank you for using e-Visits. ?  ? ?

## 2021-06-12 ENCOUNTER — Other Ambulatory Visit: Payer: Self-pay

## 2021-06-12 ENCOUNTER — Ambulatory Visit (HOSPITAL_BASED_OUTPATIENT_CLINIC_OR_DEPARTMENT_OTHER): Payer: 59 | Admitting: Nurse Practitioner

## 2021-06-12 ENCOUNTER — Other Ambulatory Visit (HOSPITAL_BASED_OUTPATIENT_CLINIC_OR_DEPARTMENT_OTHER): Payer: Self-pay

## 2021-06-12 ENCOUNTER — Encounter (HOSPITAL_BASED_OUTPATIENT_CLINIC_OR_DEPARTMENT_OTHER): Payer: Self-pay | Admitting: Nurse Practitioner

## 2021-06-12 VITALS — BP 117/72 | HR 86 | Temp 98.4°F | Ht 62.0 in | Wt 147.8 lb

## 2021-06-12 DIAGNOSIS — H8110 Benign paroxysmal vertigo, unspecified ear: Secondary | ICD-10-CM | POA: Diagnosis not present

## 2021-06-12 MED ORDER — MECLIZINE HCL 25 MG PO TABS
25.0000 mg | ORAL_TABLET | Freq: Three times a day (TID) | ORAL | 1 refills | Status: DC | PRN
  Filled 2021-06-12: qty 90, 30d supply, fill #0

## 2021-06-12 MED ORDER — ONDANSETRON HCL 4 MG PO TABS
4.0000 mg | ORAL_TABLET | Freq: Three times a day (TID) | ORAL | 1 refills | Status: DC | PRN
  Filled 2021-06-12: qty 30, 10d supply, fill #0

## 2021-06-12 NOTE — Progress Notes (Signed)
? ?Acute Office Visit ? ?Subjective:  ? ? Patient ID: Victoria Garrison, female    DOB: 04-Apr-1970, 51 y.o.   MRN: [id number] ? ?Chief Complaint  ?Patient presents with  ? Dizziness  ? ? ?HPI ?Patient is in today for dizziness.  ?She reports the symptoms first started on Saturday with intermittent dizziness when moving her head. She reports the symptoms have continued consistently and are becoming quite bothersome.  ?She endorses symptoms are worse when moving from lying to seated position. She has also experienced symptoms while sitting still with slight movements of the head. She feels like things around her are moving rather than her body moving. She does feel sick on her stomach, but no vomiting or diarrhea.  ?No URI sx, COVID on February 7th but no other illness. Mild allergy symptoms present.  ? ? ?Review of Systems  ?Constitutional:  Negative for chills, diaphoresis and fatigue.  ?HENT:  Negative for congestion, postnasal drip, rhinorrhea, sinus pressure, sinus pain and tinnitus.   ?Eyes:  Positive for visual disturbance. Negative for photophobia.  ?Respiratory:  Negative for chest tightness.   ?Cardiovascular:  Negative for chest pain, palpitations and leg swelling.  ?Gastrointestinal:  Positive for nausea. Negative for vomiting.  ?Neurological:  Positive for dizziness. Negative for tremors, facial asymmetry, weakness, light-headedness, numbness and headaches.  ?Psychiatric/Behavioral:  Negative for sleep disturbance. The patient is not nervous/anxious.   ? ?   ?Objective:  ?  ?Physical Exam ?Vitals and nursing note reviewed.  ?Constitutional:   ?   Appearance: Normal appearance.  ?HENT:  ?   Head: Normocephalic.  ?   Right Ear: Hearing normal. A middle ear effusion is present.  ?   Left Ear: Hearing normal. A middle ear effusion is present.  ?   Nose: Nose normal.  ?   Mouth/Throat:  ?   Mouth: Mucous membranes are moist.  ?   Pharynx: Oropharynx is clear.  ?Eyes:  ?   General: Lids are normal. Lids are  everted, no foreign bodies appreciated. Vision grossly intact. Gaze aligned appropriately. No visual field deficit. ?   Extraocular Movements:  ?   Right eye: Nystagmus present.  ?   Left eye: Nystagmus present.  ?   Visual Fields: Right eye visual fields normal and left eye visual fields normal.  ?Cardiovascular:  ?   Rate and Rhythm: Normal rate and regular rhythm.  ?   Chest Wall: PMI is not displaced.  ?   Pulses: Normal pulses.  ?Pulmonary:  ?   Effort: Pulmonary effort is normal.  ?   Breath sounds: Normal breath sounds and air entry.  ?Musculoskeletal:  ?   Cervical back: Full passive range of motion without pain. No pain with movement. Normal range of motion.  ?Neurological:  ?   General: No focal deficit present.  ?   Mental Status: She is alert and oriented to person, place, and time.  ?   Cranial Nerves: Cranial nerves 2-12 are intact.  ?   Sensory: Sensation is intact.  ?   Motor: Motor function is intact.  ?   Coordination: Romberg sign positive. Heel to [facility] Test abnormal.  ?   Gait: Tandem walk abnormal.  ?   Deep Tendon Reflexes: Reflexes are normal and symmetric.  ? ? ?BP 117/72   Pulse 86   Temp 98.4 ?F (36.9 ?C)   Ht 5' 2"  (1.575 m)   Wt 147 lb 12.8 oz (67 kg)   SpO2 97%  BMI 27.03 kg/m?  ?Wt Readings from Last 3 Encounters:  ?06/12/21 147 lb 12.8 oz (67 kg)  ?05/07/21 144 lb (65.3 kg)  ?09/10/20 172 lb 6.4 oz (78.2 kg)  ? ?   ?Assessment & Plan:  ? ?Problem List Items Addressed This Visit   ?None ?Visit Diagnoses   ? ? Benign paroxysmal positional vertigo, unspecified laterality    -  Primary  ? Relevant Medications  ? meclizine (ANTIVERT) 25 MG tablet  ? ondansetron (ZOFRAN) 4 MG tablet  ? ?  ?Symptoms consistent with BPPV- lateral nystagmus present worse in the left > right with no deficits in cranial nerves, strength, or motor function present. Positive Romberg and heel to toe gait. No alarm sx present today. ?Will plan to begin treatment with meclizine and PRN ondansetron for residual  nausea. Recommend [employer] maneuvers for rehabilitation. Patient would like to try these on her own today, but will let me know if these are ineffective and we will consider formal PT.  ?F/U if symptoms worsen or fail to improve.  ? ? ?Meds ordered this encounter  ?Medications  ? meclizine (ANTIVERT) 25 MG tablet  ?  Sig: Take 1 tablet (25 mg total) by mouth 3 (three) times daily as needed for dizziness.  ?  Dispense:  90 tablet  ?  Refill:  1  ? ondansetron (ZOFRAN) 4 MG tablet  ?  Sig: Take 1 tablet (4 mg total) by mouth every 8 (eight) hours as needed for nausea or vomiting.  ?  Dispense:  30 tablet  ?  Refill:  1  ? ? ? ?Orma Render, NP ? ?

## 2021-06-12 NOTE — Patient Instructions (Signed)
I have sent in meclizine for you to take three times a day to help with the dizziness. Take this for at least one week to help keep the symptoms under control.  ? ?I have sent in zofran for you to take if your nausea gets very bad and you can also take one about 30 minutes before doing the movements on the handout to help prevent nausea that can sometimes come with the movements. ?

## 2021-09-10 IMAGING — MG MM DIGITAL SCREENING BILAT W/ TOMO AND CAD
8 series · 9 of 24 positions shown · non-contrast
Comparison: Previous exam(s).

CLINICAL DATA: Screening.

EXAM:
DIGITAL SCREENING BILATERAL MAMMOGRAM WITH TOMOSYNTHESIS AND CAD
TECHNIQUE: Bilateral screening digital craniocaudal and mediolateral oblique
mammograms were obtained. Bilateral screening digital breast
tomosynthesis was performed. The images were evaluated with
computer-aided detection.

[L CC synth-2D]
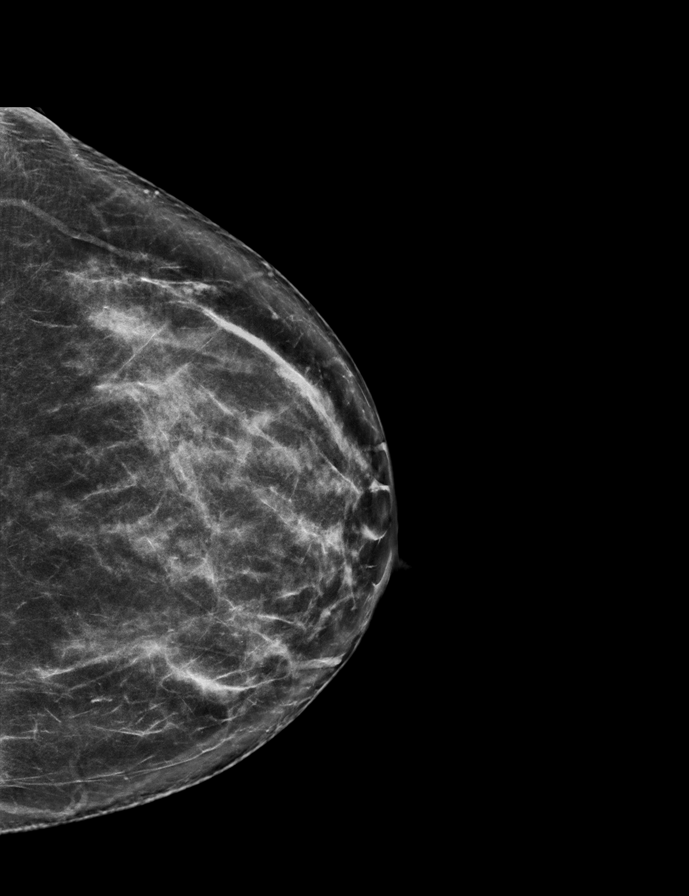

[R CC synth-2D]
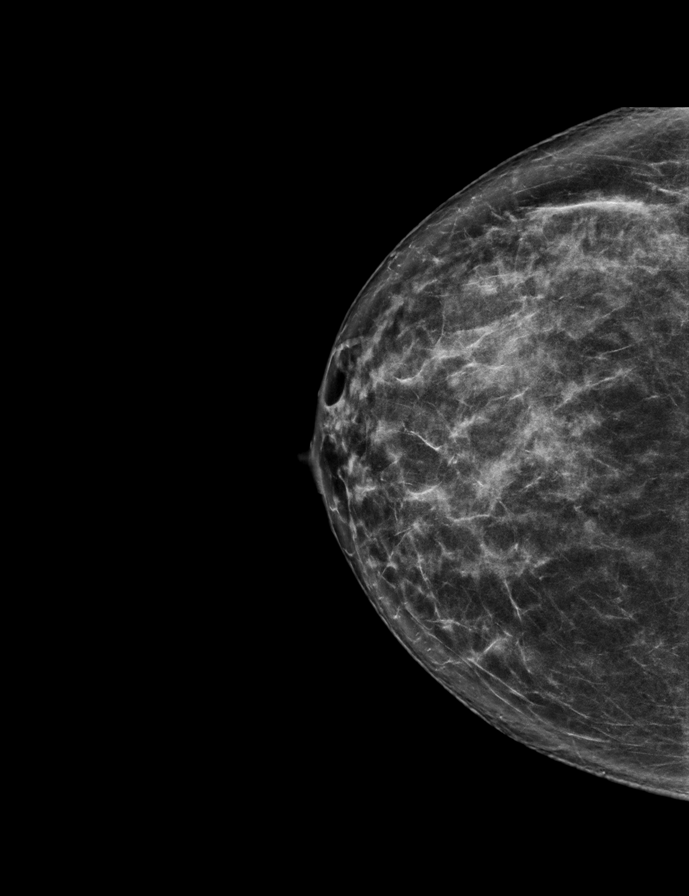

[R MLO synth-2D]
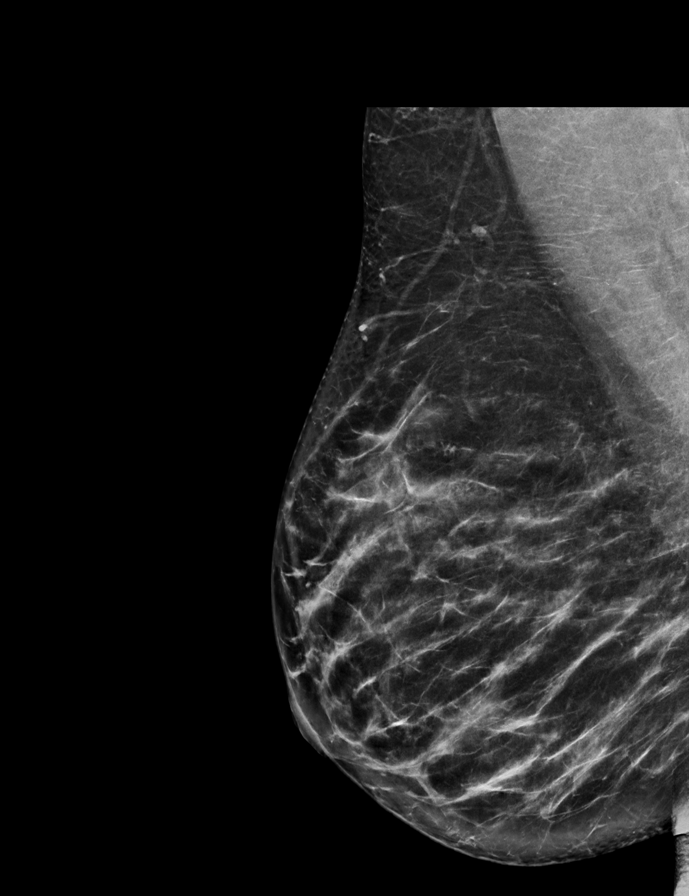

[L MLO synth-2D]
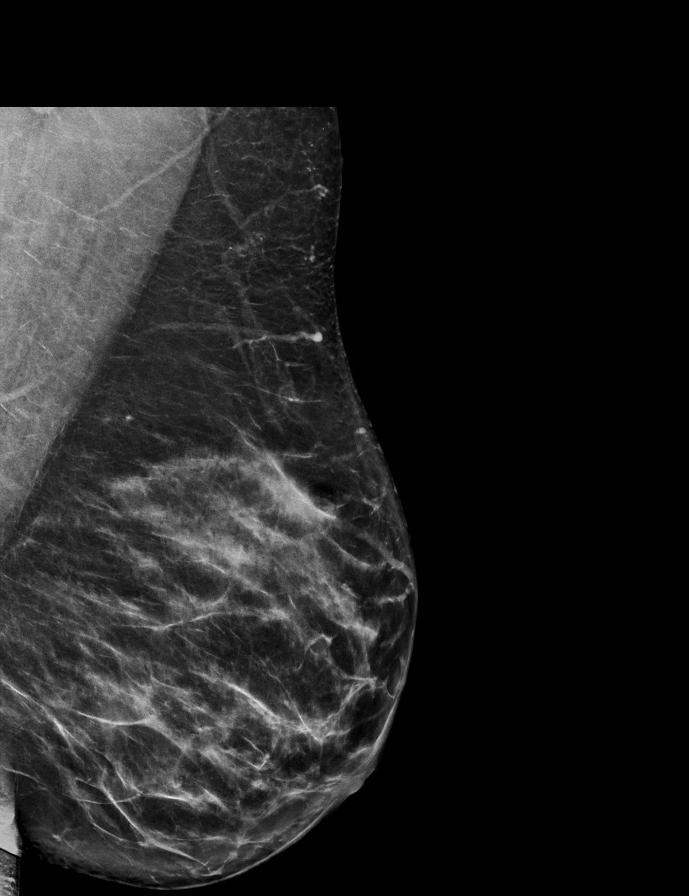

[L MLO tomo · 2 of 76 frames shown]
[frame 25/76]
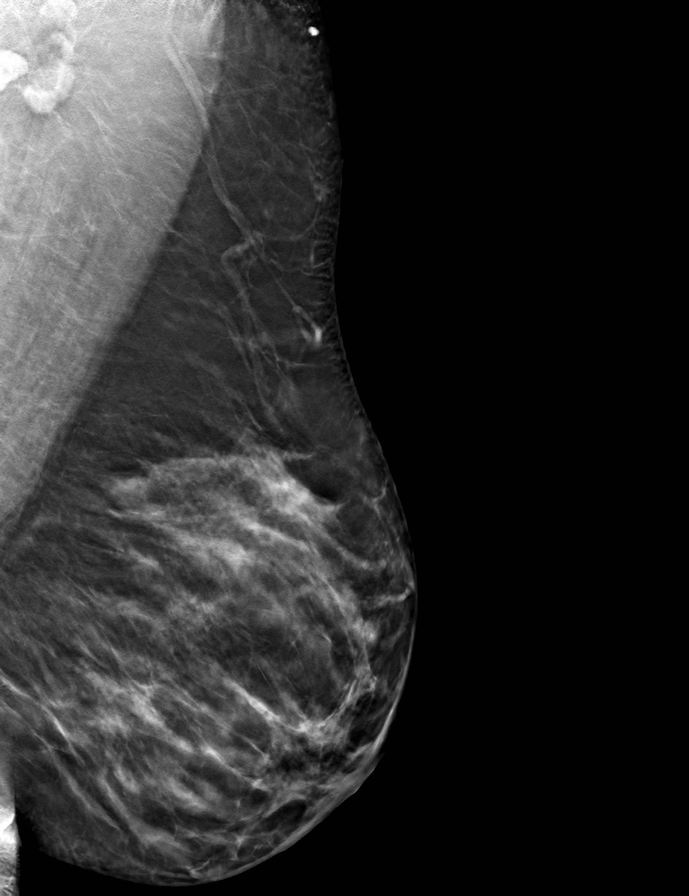
[frame 39/76]
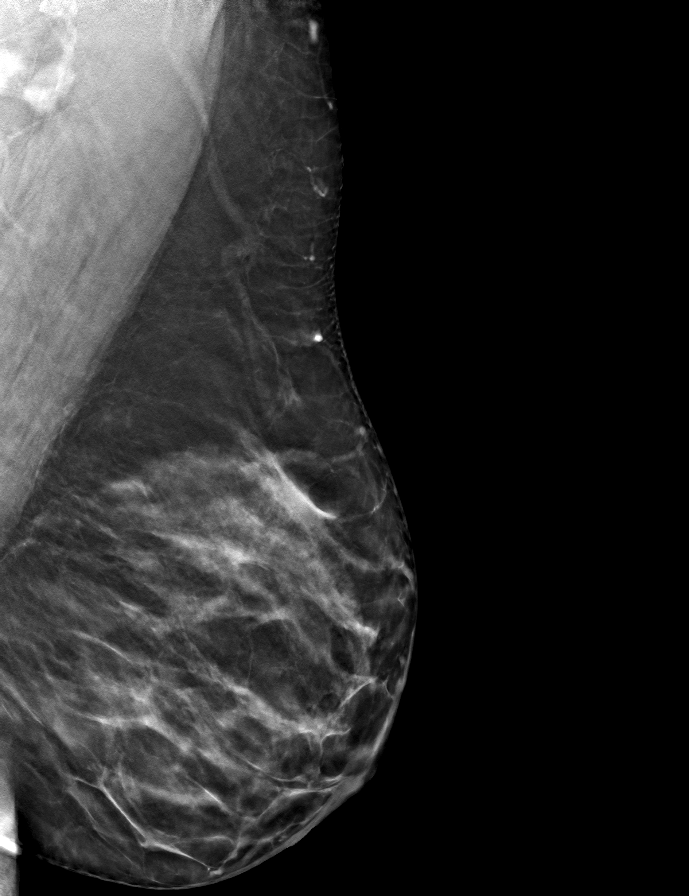

[R MLO tomo · tomo slice 39/76.0]
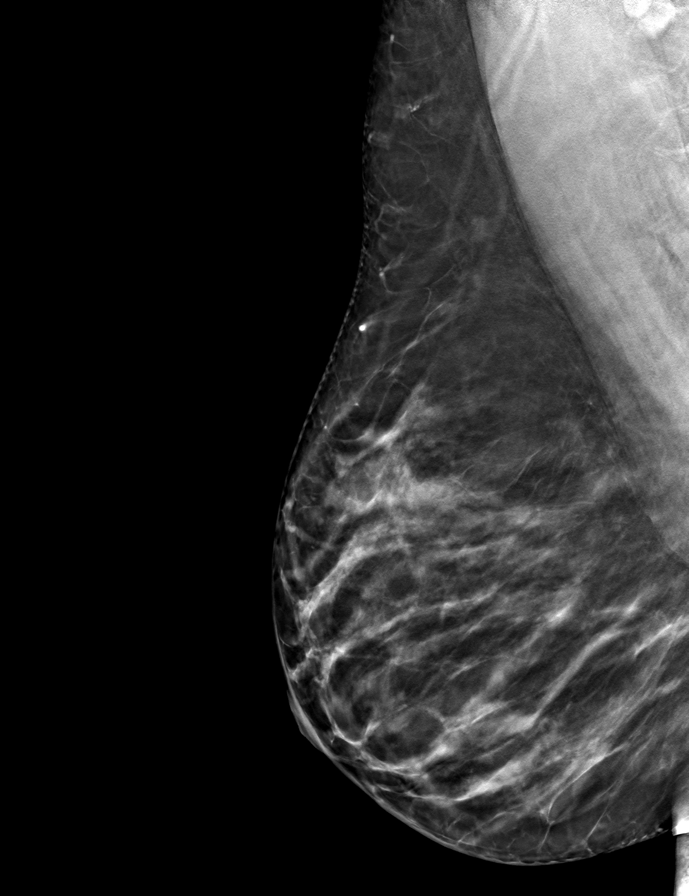

[L CC tomo · tomo slice 36/71.0]
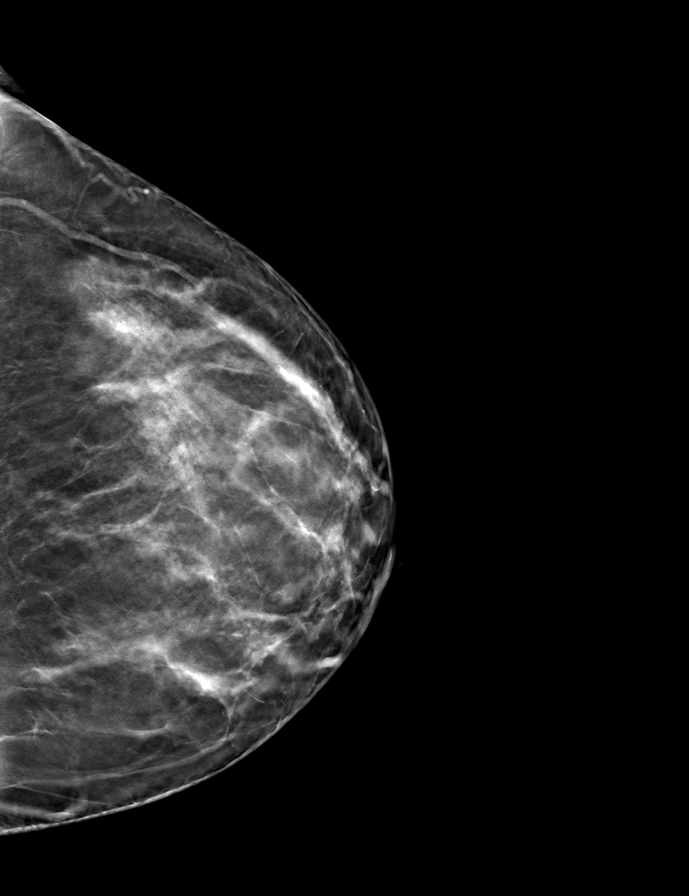

[R CC tomo · tomo slice 35/70.0]
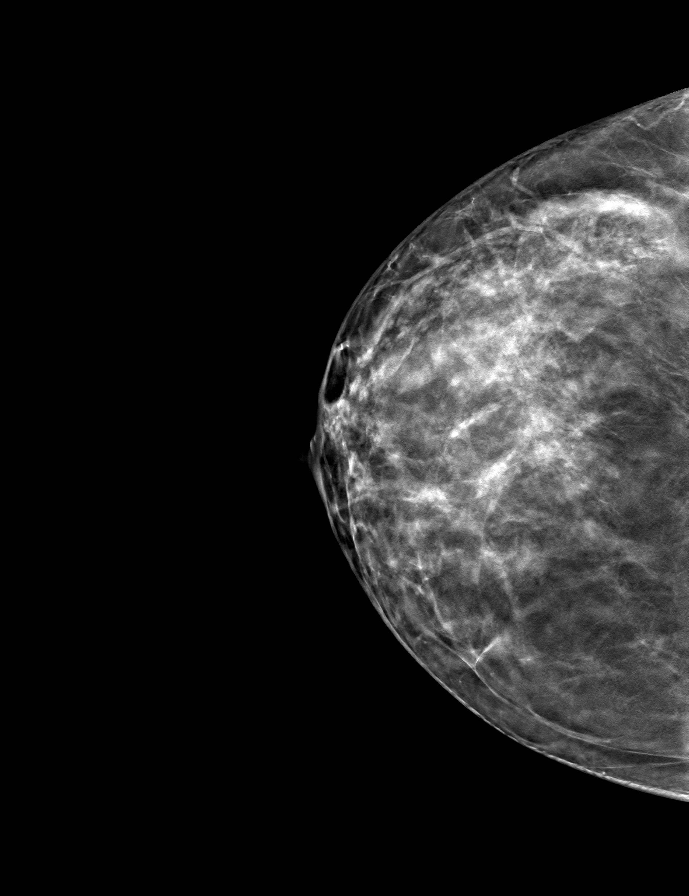

[9 of 24 positions shown; findings below may reference images not displayed]

ACR Breast Density Category c: The breast tissue is heterogeneously
dense, which may obscure small masses.
FINDINGS: There are no findings suspicious for malignancy.
IMPRESSION: No mammographic evidence of malignancy. A result letter of this
screening mammogram will be mailed directly to the patient.

RECOMMENDATION:
Screening mammogram in one year. (Code:Q3-W-BC3)

BI-RADS CATEGORY  1: Negative.

## 2021-09-12 ENCOUNTER — Encounter (HOSPITAL_BASED_OUTPATIENT_CLINIC_OR_DEPARTMENT_OTHER): Payer: 59 | Admitting: Nurse Practitioner

## 2021-10-15 ENCOUNTER — Other Ambulatory Visit: Payer: Self-pay | Admitting: Nurse Practitioner

## 2021-10-15 DIAGNOSIS — Z1231 Encounter for screening mammogram for malignant neoplasm of breast: Secondary | ICD-10-CM

## 2021-10-16 ENCOUNTER — Encounter (HOSPITAL_BASED_OUTPATIENT_CLINIC_OR_DEPARTMENT_OTHER): Payer: Self-pay | Admitting: Family Medicine

## 2021-10-16 ENCOUNTER — Encounter (HOSPITAL_BASED_OUTPATIENT_CLINIC_OR_DEPARTMENT_OTHER): Payer: Self-pay | Admitting: Nurse Practitioner

## 2021-10-16 ENCOUNTER — Ambulatory Visit (INDEPENDENT_AMBULATORY_CARE_PROVIDER_SITE_OTHER): Payer: 59 | Admitting: Family Medicine

## 2021-10-16 DIAGNOSIS — N6459 Other signs and symptoms in breast: Secondary | ICD-10-CM | POA: Insufficient documentation

## 2021-10-16 NOTE — Assessment & Plan Note (Addendum)
Patient is a 51 year old female presenting for evaluation of recently noticed nipple abnormality.  She reports that both nipples have been sore, but has noted that left nipple has some discoloration and possibly some dry skin/chafing. Noticed these symptoms about 1 day ago. No drainage. No real pain, just some soreness. No history reported regarding breast or nipple concerns in the past. No fever, chills, sweats. Does have family history of breast cancer - aunt on mom's side, grandmother on father's side. On exam, right breast with no overlying skin or nipple changes.  Palpation of right breast with no abnormal masses or nodules palpated.  There is no nipple discharge.  No tenderness to palpation through right breast.  Left breast with no obvious skin changes or significant nipple changes.  At about the 6 o'clock position within the areola, there is some slight skin irregularity as compared to the rest of the areola.  There is no left nipple discharge, oozing or crusting.  Palpation of the left breast without any discrete nodules or masses palpated.  No dimpling is observed in either breast. Chaperone: Lowella Bandy, CMA Discussed that observed nipple irregularity is not concerning at this time.  Did discuss that at times, skin changes overlying the breast/nipple can indicate potential for underlying soft tissue issue or potential cancer, however do not feel that her current skin changes would suggest this to be the case for her.  She does have a mammogram scheduled for next week, would recommend proceeding with this.  If abnormality identified on mammogram, would proceed with further evaluation as indicated If mammogram is normal, can continue to monitor skin changes for now and reassess in about 2 to 3 months.  If skin changes to resolve, would continue with monitoring.  If skin changes persist or if any progression in skin changes noted, consideration may be for local biopsy for further assessment. Discussed  signs and symptoms to be aware of which could indicate more serious underlying issue including new nipple discharge, bleeding, bruising, drainage, worsening pain, breast mass developing.

## 2021-10-16 NOTE — Patient Instructions (Signed)
  Medication Instructions:  Your physician recommends that you continue on your current medications as directed. Please refer to the Current Medication list given to you today. --If you need a refill on any your medications before your next appointment, please call your pharmacy first. If no refills are authorized on file call the office.-- Lab Work: Your physician has recommended that you have lab work today: No If you have labs (blood work) drawn today and your tests are completely normal, you will receive your results via [facility] a phone call from our staff.  Please ensure you check your voicemail in the event that you authorized detailed messages to be left on a delegated number. If you have any lab test that is abnormal or we need to change your treatment, we will call you to review the results.  Referrals/Procedures/Imaging: Mammogram appointment scheduled for next week.  Follow-Up: Your next appointment:   Your physician recommends that you schedule a follow-up appointment in: 2-3 months with Jacolyn Reedy, NP.  You will receive a text message or e-mail with a link to a survey about your care and experience with [place] today! We would greatly appreciate your feedback!   Thanks for letting us be apart of your health journey!!  Primary Care and Sports Medicine   Dr. Arlina Robes [place]   We encourage you to activate your patient portal called "MyChart".  Sign up information is provided on this After Visit Summary.  MyChart is used to connect with patients for Virtual Visits (Telemedicine).  Patients are able to view lab/test results, encounter notes, upcoming appointments, etc.  Non-urgent messages can be sent to your provider as well. To learn more about what you can do with MyChart, please visit --  [email or web address].

## 2021-10-16 NOTE — Progress Notes (Signed)
    Procedures performed today:    None.  Independent interpretation of notes and tests performed by another provider:   None.  Brief History, Exam, Impression, and Recommendations:    BP (!) 141/75   Pulse 72   Ht '5\' 2"'$  (1.575 m)   Wt 154 lb 8 oz (70.1 kg)   SpO2 100%   BMI 28.26 kg/m   Nipple problem Patient is a 51 year old female presenting for evaluation of recently noticed nipple abnormality.  She reports that both nipples have been sore, but has noted that left nipple has some discoloration and possibly some dry skin/chafing. Noticed these symptoms about 1 day ago. No drainage. No real pain, just some soreness. No history reported regarding breast or nipple concerns in the past. No fever, chills, sweats. Does have family history of breast cancer - aunt on mom's side, grandmother on father's side. On exam, right breast with no overlying skin or nipple changes.  Palpation of right breast with no abnormal masses or nodules palpated.  There is no nipple discharge.  No tenderness to palpation through right breast.  Left breast with no obvious skin changes or significant nipple changes.  At about the 6 o'clock position within the areola, there is some slight skin irregularity as compared to the rest of the areola.  There is no left nipple discharge, oozing or crusting.  Palpation of the left breast without any discrete nodules or masses palpated.  No dimpling is observed in either breast. Chaperone: Lowella Bandy, CMA Discussed that observed nipple irregularity is not concerning at this time.  Did discuss that at times, skin changes overlying the breast/nipple can indicate potential for underlying soft tissue issue or potential cancer, however do not feel that her current skin changes would suggest this to be the case for her.  She does have a mammogram scheduled for next week, would recommend proceeding with this.  If abnormality identified on mammogram, would proceed with further  evaluation as indicated If mammogram is normal, can continue to monitor skin changes for now and reassess in about 2 to 3 months.  If skin changes to resolve, would continue with monitoring.  If skin changes persist or if any progression in skin changes noted, consideration may be for local biopsy for further assessment. Discussed signs and symptoms to be aware of which could indicate more serious underlying issue including new nipple discharge, bleeding, bruising, drainage, worsening pain, breast mass developing.  Return in about 3 months (around 01/16/2022) for Nipple changes with PCP.   ___________________________________________ [name] de [place], MD, ABFM, [facility] and [place]

## 2021-10-17 ENCOUNTER — Other Ambulatory Visit: Payer: Self-pay | Admitting: Adult Health

## 2021-10-17 DIAGNOSIS — Z1231 Encounter for screening mammogram for malignant neoplasm of breast: Secondary | ICD-10-CM

## 2021-10-23 ENCOUNTER — Ambulatory Visit
Admission: RE | Admit: 2021-10-23 | Discharge: 2021-10-23 | Disposition: A | Discharge status: Home / Self Care | Payer: 59 | Source: Ambulatory Visit

## 2021-10-23 DIAGNOSIS — Z1231 Encounter for screening mammogram for malignant neoplasm of breast: Secondary | ICD-10-CM | POA: Diagnosis not present

## 2022-01-16 ENCOUNTER — Ambulatory Visit (HOSPITAL_BASED_OUTPATIENT_CLINIC_OR_DEPARTMENT_OTHER): Payer: 59 | Admitting: Nurse Practitioner

## 2022-01-20 ENCOUNTER — Other Ambulatory Visit (HOSPITAL_BASED_OUTPATIENT_CLINIC_OR_DEPARTMENT_OTHER): Payer: Self-pay

## 2022-07-02 DIAGNOSIS — M79644 Pain in right finger(s): Secondary | ICD-10-CM | POA: Insufficient documentation

## 2022-07-24 ENCOUNTER — Ambulatory Visit
Admission: RE | Admit: 2022-07-24 | Discharge: 2022-07-24 | Disposition: A | Discharge status: Home / Self Care | Payer: 59 | Source: Ambulatory Visit | Attending: Nurse Practitioner | Admitting: Nurse Practitioner

## 2022-07-24 ENCOUNTER — Ambulatory Visit (INDEPENDENT_AMBULATORY_CARE_PROVIDER_SITE_OTHER): Payer: 59 | Admitting: Nurse Practitioner

## 2022-07-24 ENCOUNTER — Encounter: Payer: Self-pay | Admitting: Nurse Practitioner

## 2022-07-24 VITALS — BP 122/76 | HR 82 | Wt 165.4 lb

## 2022-07-24 DIAGNOSIS — M79644 Pain in right finger(s): Secondary | ICD-10-CM | POA: Diagnosis not present

## 2022-07-24 DIAGNOSIS — M25541 Pain in joints of right hand: Secondary | ICD-10-CM | POA: Diagnosis not present

## 2022-07-24 NOTE — Patient Instructions (Signed)
I will be in touch with the x-ray results and we will determine next steps.

## 2022-07-24 NOTE — Progress Notes (Signed)
  [name], DNP, AGNP-c [facility] [address] [place], [place] [address] [phone]  Subjective:   Victoria Garrison is a 52 y.o. female presents to day for evaluation of: Right thumb pain Victoria Garrison presents today with persistent pain in her right thumb. She describes this as sudden onset, waking up one night with discomfort. Initially, the pain felt like dislocation with appearance of the thumb in abnormal positioning, requiring her to manually adjust the thumb back into prescription. Since that time, the thumb pops out of socket, approximately every other day and the pain has been increasing, particularly in the the proximal joint. She denies any known history of trauma or injury to the area or gout.   She also mentions the pain has migrated from the distal joint of the thumb to now also include the proximal joint, as well. She experiences tenderness with palpation and movement. She denies numbness, tingling, or erythema present.   She is not currently taking any medication and has not tried bracing of the joint.   PMH, Medications, and Allergies reviewed and updated in chart as appropriate.   ROS negative except for what is listed in HPI. Objective:  BP 122/76   Pulse 82   Wt 165 lb 6.4 oz (75 kg)   BMI 30.25 kg/m  Physical Exam Vitals and nursing note reviewed.  Constitutional:      General: She is not in acute distress.    Appearance: Normal appearance.  HENT:     Head: Normocephalic.  Eyes:     General: No scleral icterus.    Conjunctiva/sclera: Conjunctivae normal.  Musculoskeletal:        General: Swelling and tenderness present.       Arms:  Skin:    General: Skin is warm and dry.     Capillary Refill: Capillary refill takes less than 2 seconds.     Findings: No erythema.  Neurological:     General: No focal deficit present.     Mental Status: She is alert and oriented to person, place, and time.  Psychiatric:        Mood and Affect: Mood  normal.           Assessment & Plan:   Problem List Items Addressed This Visit     Pain of right thumb - Primary    Significant swelling and pain noted in the right thumb at both the distal and proximal joint spaces. Concerns are present for possible fracture or tendon/ligament injury. No other joint inclusion helps to rule out systemic concern.  Plan: - We will obtain x-rays today for examination of the bones to ensure no concerns with fracture or bony abnormality.  - splinting of the thumb performed.  - consider orthopedic referral based on results.       Relevant Orders   DG Finger Thumb Right (Completed)      [name], DNP, AGNP-c 08/12/2022  7:40 AM    History, Medications, Surgery, SDOH, and Family History reviewed and updated as appropriate.

## 2022-07-28 ENCOUNTER — Encounter: Payer: Self-pay | Admitting: Nurse Practitioner

## 2022-08-12 ENCOUNTER — Ambulatory Visit: Payer: Self-pay | Admitting: Adult Health

## 2022-08-12 NOTE — Assessment & Plan Note (Signed)
Significant swelling and pain noted in the right thumb at both the distal and proximal joint spaces. Concerns are present for possible fracture or tendon/ligament injury. No other joint inclusion helps to rule out systemic concern.  Plan: - We will obtain x-rays today for examination of the bones to ensure no concerns with fracture or bony abnormality.  - splinting of the thumb performed.  - consider orthopedic referral based on results.

## 2022-09-11 ENCOUNTER — Other Ambulatory Visit: Payer: Self-pay | Admitting: Nurse Practitioner

## 2022-09-11 DIAGNOSIS — Z1231 Encounter for screening mammogram for malignant neoplasm of breast: Secondary | ICD-10-CM

## 2022-10-17 ENCOUNTER — Encounter: Payer: 59 | Admitting: Nurse Practitioner

## 2022-10-17 DIAGNOSIS — Z0000 Encounter for general adult medical examination without abnormal findings: Secondary | ICD-10-CM

## 2022-10-23 DIAGNOSIS — Z1231 Encounter for screening mammogram for malignant neoplasm of breast: Secondary | ICD-10-CM

## 2022-10-27 ENCOUNTER — Ambulatory Visit
Admission: RE | Admit: 2022-10-27 | Discharge: 2022-10-27 | Disposition: A | Discharge status: Home / Self Care | Payer: 59 | Source: Ambulatory Visit

## 2022-10-27 DIAGNOSIS — Z1231 Encounter for screening mammogram for malignant neoplasm of breast: Secondary | ICD-10-CM

## 2022-11-13 ENCOUNTER — Ambulatory Visit (INDEPENDENT_AMBULATORY_CARE_PROVIDER_SITE_OTHER): Payer: 59 | Admitting: Nurse Practitioner

## 2022-11-13 ENCOUNTER — Encounter: Payer: Self-pay | Admitting: Nurse Practitioner

## 2022-11-13 VITALS — BP 124/82 | HR 74 | Ht 62.5 in | Wt 167.6 lb

## 2022-11-13 DIAGNOSIS — Z1159 Encounter for screening for other viral diseases: Secondary | ICD-10-CM | POA: Diagnosis not present

## 2022-11-13 DIAGNOSIS — Z0000 Encounter for general adult medical examination without abnormal findings: Secondary | ICD-10-CM | POA: Diagnosis not present

## 2022-11-13 DIAGNOSIS — Z114 Encounter for screening for human immunodeficiency virus [HIV]: Secondary | ICD-10-CM | POA: Diagnosis not present

## 2022-11-13 DIAGNOSIS — Z23 Encounter for immunization: Secondary | ICD-10-CM | POA: Diagnosis not present

## 2022-11-13 MED ORDER — ZOSTER VAC RECOMB ADJUVANTED 50 MCG/0.5ML IM SUSR
0.5000 mL | Freq: Once | INTRAMUSCULAR | 1 refills | Status: AC
Start: 2022-11-13 — End: 2022-11-13

## 2022-11-13 NOTE — Progress Notes (Signed)
[name], DNP, AGNP-c [facility] [address] [place], [place] [address] Main Office [phone]  BP 124/82   Pulse 74   Ht 5' 2.5" (1.588 m)   Wt 167 lb 9.6 oz (76 kg)   BMI 30.17 kg/m    Subjective:    Patient ID: Victoria Garrison, female    DOB: Aug 26, 1970, 52 y.o.   MRN: [id number]  HPI: Victoria Garrison is a 52 y.o. female presenting on 11/13/2022 for comprehensive medical examination.   Current medical concerns include: none  A comprehensive review of systems was negative.  IMMUNIZATIONS:   Flu: Flu vaccine postponed until flu season Prevnar 13: Prevnar 13 N/A for this patient Prevnar 20: Prevnar 20 N/A for this patient Pneumovax 23: Pneumovax 23 N/A for this patient Vac Shingrix: Shingrix due, prescription provided HPV: HPV N/A for this patient Tetanus: Tetanus completed in the last 10 years- completed at [facility] COVID: COVID declined, patient will complete at a later date  HEALTH MAINTENANCE: Pap Smear HM Status: is up to date Mammogram HM Status: is up to date Colon Cancer Screening HM Status: is up to date Bone Density HM Status: is not applicable for this patient STI Testing HM Status: was declined  Lung CT HM Status: is not applicable for this patient  She denies concerns with vision, hearing, or dentition.  She has started wearing reading glasses while working on the computer.   She reports a regular diet. She walks occasionally and helps care for her 3 grandchildren every evening.   Most Recent Depression Screen:     11/13/2022    9:54 AM 10/16/2021   10:53 AM 06/12/2021    9:46 AM 05/07/2021    8:37 AM 09/10/2020    9:39 AM  Depression screen PHQ 2/9  Decreased Interest 0 0 0 0 0  Down, Depressed, Hopeless 0 0 0 0 0  PHQ - 2 Score 0 0 0 0 0  Altered sleeping  0  0 0  Tired, decreased energy  0  0 0  Change in appetite  0  0 0  Feeling bad or failure about yourself   0  0 0  Trouble concentrating  0  0 0  Moving slowly or  fidgety/restless  0  0 0  Suicidal thoughts  0  0 0  PHQ-9 Score  0  0 0  Difficult doing work/chores  Not difficult at all      Most Recent Anxiety Screen:     05/07/2021    8:37 AM 09/10/2020    9:40 AM  GAD 7 : Generalized Anxiety Score  Nervous, Anxious, on Edge 0 0  Control/stop worrying 0 0  Worry too much - different things 0 0  Trouble relaxing 0 1  Restless 0 0  Easily annoyed or irritable 0 0  Afraid - awful might happen 0 0  Total GAD 7 Score 0 1   Most Recent Fall Screen:    11/13/2022    9:54 AM 10/16/2021   10:53 AM 06/12/2021    9:46 AM 09/10/2020    9:39 AM 05/05/2018    9:13 AM  Fall Risk   Falls in the past year? 0 0 0 0 0  Number falls in past yr: 0 0 0 0   Injury with Fall? 0 0 0 0   Risk for fall due to : No Fall Risks No Fall Risks No Fall Risks No Fall Risks   Follow up Falls evaluation completed Falls  evaluation completed Falls evaluation completed Falls evaluation completed     Past medical history, surgical history, medications, allergies, family history and social history reviewed with patient today and changes made to appropriate areas of the chart.  Past Medical History:  Past Medical History:  Diagnosis Date   Abnormal Pap smear    Anemia    Due to heavy menustral cycles   Dysmenorrhea    Fibroadenoma of left breast    Fibroids 09/02/2012   HSV-1 (herpes simplex virus 1) infection    Menorrhagia    Vaginal Pap smear, abnormal    Medications:  Current Outpatient Medications on File Prior to Visit  Medication Sig   ibuprofen (ADVIL) 200 MG tablet Take 200 mg by mouth every 6 (six) hours as needed.   No current facility-administered medications on file prior to visit.   Surgical History:  Past Surgical History:  Procedure Laterality Date   CHOLECYSTECTOMY  03/17/2012   DILITATION & CURRETTAGE/HYSTROSCOPY WITH THERMACHOICE ABLATION N/A 11/04/2012   Procedure: HYSTEROSCOPY; DILATATION AND CURETTAGE;THERMACHOICE ENDOMETRIAL ABLATION (Total  Therapy Time=9 min 18sec);  Surgeon: Tilda Burrow, MD;  Location: [facility];  Service: Gynecology;  Laterality: N/A;   VAGINAL DELIVERY     3   Allergies:  Allergies  Allergen Reactions   Guaifenesin     Hives    Family History:  Family History  Problem Relation Age of Onset   CAD Paternal Grandmother    Cancer Paternal Grandmother        breast   Breast cancer Paternal Grandmother    Cancer Father        basal cell skin   Throat cancer Father    COPD Mother    Heart disease Mother    Diabetes Mother    Hypertension Mother    Cancer Maternal Aunt        breast   CAD Maternal Aunt    Breast cancer Maternal Aunt        Objective:    BP 124/82   Pulse 74   Ht 5' 2.5" (1.588 m)   Wt 167 lb 9.6 oz (76 kg)   BMI 30.17 kg/m   Wt Readings from Last 3 Encounters:  11/13/22 167 lb 9.6 oz (76 kg)  07/24/22 165 lb 6.4 oz (75 kg)  10/16/21 154 lb 8 oz (70.1 kg)    Physical Exam Vitals and nursing note reviewed.  Constitutional:      General: She is not in acute distress.    Appearance: Normal appearance.  HENT:     Head: Normocephalic and atraumatic.     Right Ear: Hearing, tympanic membrane, ear canal and external ear normal.     Left Ear: Hearing, tympanic membrane, ear canal and external ear normal.     Nose: Nose normal.     Right Sinus: No maxillary sinus tenderness or frontal sinus tenderness.     Left Sinus: No maxillary sinus tenderness or frontal sinus tenderness.     Mouth/Throat:     Lips: Pink.     Mouth: Mucous membranes are moist.     Pharynx: Oropharynx is clear.  Eyes:     General: Lids are normal. Vision grossly intact.     Extraocular Movements: Extraocular movements intact.     Conjunctiva/sclera: Conjunctivae normal.     Pupils: Pupils are equal, round, and reactive to light.     Funduscopic exam:    Right eye: Red reflex present.        Left  eye: Red reflex present.    Visual Fields: Right eye visual fields normal and left eye visual fields  normal.  Neck:     Thyroid: No thyromegaly.     Vascular: No carotid bruit.  Cardiovascular:     Rate and Rhythm: Normal rate and regular rhythm.     Chest Wall: PMI is not displaced.     Pulses: Normal pulses.          Dorsalis pedis pulses are 2+ on the right side and 2+ on the left side.       Posterior tibial pulses are 2+ on the right side and 2+ on the left side.     Heart sounds: Normal heart sounds. No murmur heard. Pulmonary:     Effort: Pulmonary effort is normal. No respiratory distress.     Breath sounds: Normal breath sounds.  Abdominal:     General: Abdomen is flat. Bowel sounds are normal. There is no distension.     Palpations: Abdomen is soft. There is no hepatomegaly, splenomegaly or mass.     Tenderness: There is no abdominal tenderness. There is no right CVA tenderness, left CVA tenderness, guarding or rebound.  Musculoskeletal:        General: Normal range of motion.     Cervical back: Full passive range of motion without pain, normal range of motion and neck supple. No tenderness.     Right lower leg: No edema.     Left lower leg: No edema.  Feet:     Left foot:     Toenail Condition: Left toenails are normal.  Lymphadenopathy:     Cervical: No cervical adenopathy.     Upper Body:     Right upper body: No supraclavicular adenopathy.     Left upper body: No supraclavicular adenopathy.  Skin:    General: Skin is warm and dry.     Capillary Refill: Capillary refill takes less than 2 seconds.     Nails: There is no clubbing.  Neurological:     General: No focal deficit present.     Mental Status: She is alert and oriented to person, place, and time.     GCS: GCS eye subscore is 4. GCS verbal subscore is 5. GCS motor subscore is 6.     Sensory: Sensation is intact.     Motor: Motor function is intact.     Coordination: Coordination is intact.     Gait: Gait is intact.     Deep Tendon Reflexes: Reflexes are normal and symmetric.  Psychiatric:         Attention and Perception: Attention normal.        Mood and Affect: Mood normal.        Speech: Speech normal.        Behavior: Behavior normal. Behavior is cooperative.        Thought Content: Thought content normal.        Cognition and Memory: Cognition and memory normal.        Judgment: Judgment normal.     Results for orders placed or performed in visit on 05/07/21  Cologuard  Result Value Ref Range   COLOGUARD Negative Negative  POCT occult blood stool  Result Value Ref Range   Fecal Occult Blood, POC Negative Negative   Card #1 Date     Card #2 Fecal Occult Blod, POC     Card #2 Date     Card #3 Fecal Occult Blood, POC  Card #3 Date    Cytology - PAP( [facility])  Result Value Ref Range   High risk HPV Negative    Adequacy      Satisfactory for evaluation; transformation zone component ABSENT.   Diagnosis      - Negative for intraepithelial lesion or malignancy (NILM)   Comment Normal Reference Range HPV - Negative          Assessment & Plan:   Problem List Items Addressed This Visit     Encounter for annual physical exam - Primary    CPE completed today. Review of HM activities and recommendations discussed and provided on AVS. Anticipatory guidance, diet, and exercise recommendations provided. Medications, allergies, and hx reviewed and updated as necessary. Orders placed as listed below.  Plan: - Labs ordered. Will make changes as necessary based on results. One time HIV/HepC screen completed. - Shingles vaccine given - I will review these results and send recommendations via MyChart or a telephone call.  - F/U with CPE in 1 year or sooner for acute/chronic health needs as directed.        Relevant Medications   Zoster Vaccine Adjuvanted [facility]) injection   Other Relevant Orders   CBC with Differential/Platelet   Comprehensive metabolic panel   Lipid panel   Hepatitis C antibody   HIV Antibody (routine testing w rflx)   Other Visit Diagnoses      Need for shingles vaccine       Relevant Medications   Zoster Vaccine Adjuvanted [facility]) injection   Screening for HIV without presence of risk factors       Relevant Orders   HIV Antibody (routine testing w rflx)   Encounter for hepatitis C screening test for low risk patient       Relevant Orders   Hepatitis C antibody          Follow up plan: Return in about 1 year (around 11/13/2023) for CPE.  NEXT PREVENTATIVE PHYSICAL DUE IN 1 YEAR.  PATIENT COUNSELING PROVIDED FOR ALL ADULT PATIENTS: A well balanced diet low in saturated fats, cholesterol, and moderation in carbohydrates.  This can be as simple as monitoring portion sizes and cutting back on sugary beverages such as soda and juice to start with.    Daily water consumption of at least 64 ounces.  Physical activity at least 180 minutes per week.  If just starting out, start 10 minutes a day and work your way up.   This can be as simple as taking the stairs instead of the elevator and walking 2-3 laps around the office  purposefully every day.   STD protection, partner selection, and regular testing if high risk.  Limited consumption of alcoholic beverages if alcohol is consumed. For men, I recommend no more than 14 alcoholic beverages per week, spread out throughout the week (max 2 per day). Avoid "binge" drinking or consuming large quantities of alcohol in one setting.  Please let me know if you feel you may need help with reduction or quitting alcohol consumption.   Avoidance of nicotine, if used. Please let me know if you feel you may need help with reduction or quitting nicotine use.   Daily mental health attention. This can be in the form of 5 minute daily meditation, prayer, journaling, yoga, reflection, etc.  Purposeful attention to your emotions and mental state can significantly improve your overall wellbeing  and  Health.  Please know that I am here to help you with all of your  health care goals and  am happy to work with you to find a solution that works best for you.  The greatest advice I have received with any changes in life are to take it one step at a time, that even means if all you can focus on is the next 60 seconds, then do that and celebrate your victories.  With any changes in life, you will have set backs, and that is OK. The important thing to remember is, if you have a set back, it is not a failure, it is an opportunity to try again! Screening Testing Mammogram Every 1 -2 years based on history and risk factors Starting at age 47 Pap Smear Ages 21-39 every 3 years Ages 62-65 every 5 years with HPV testing More frequent testing may be required based on results and history Colon Cancer Screening Every 1-10 years based on test performed, risk factors, and history Starting at age 2 Bone Density Screening Every 2-10 years based on history Starting at age 7 for women Recommendations for men differ based on medication usage, history, and risk factors AAA Screening One time ultrasound Men 8-79 years old who have every smoked Lung Cancer Screening Low Dose Lung CT every 12 months Age 74-80 years with a 30 pack-year smoking history who still smoke or who have quit within the last 15 years   Screening Labs Routine  Labs: Complete Blood Count (CBC), Complete Metabolic Panel (CMP), Cholesterol (Lipid Panel) Every 6-12 months based on history and medications May be recommended more frequently based on current conditions or previous results Hemoglobin A1c Lab Every 3-12 months based on history and previous results Starting at age 7 or earlier with diagnosis of diabetes, high cholesterol, BMI >26, and/or risk factors Frequent monitoring for patients with diabetes to ensure blood sugar control Thyroid Panel (TSH) Every 6 months based on history, symptoms, and risk factors May be repeated more often if on medication HIV One time testing for all patients 61 and older May  be repeated more frequently for patients with increased risk factors or exposure Hepatitis C One time testing for all patients 72 and older May be repeated more frequently for patients with increased risk factors or exposure Gonorrhea, Chlamydia Every 12 months for all sexually active persons 13-24 years Additional monitoring may be recommended for those who are considered high risk or who have symptoms Every 12 months for any woman on birth control, regardless of sexual activity PSA Men 93-21 years old with risk factors Additional screening may be recommended from age 3-69 based on risk factors, symptoms, and history  Vaccine Recommendations Tetanus Booster All adults every 10 years Flu Vaccine All patients 6 months and older every year COVID Vaccine All patients 12 years and older Initial dosing with booster May recommend additional booster based on age and health history HPV Vaccine 2 doses all patients age 4-26 Dosing may be considered for patients over 26 Shingles Vaccine (Shingrix) 2 doses all adults 55 years and older Pneumonia (Pneumovax 73) All adults 65 years and older May recommend earlier dosing based on health history One year apart from Prevnar 55 Pneumonia (Prevnar 32) All adults 65 years and older Dosed 1 year after Pneumovax 23 Pneumonia (Prevnar 20) One time alternative to the two dosing of 13 and 23 For all adults with initial dose of 23, 20 is recommended 1 year later For all adults with initial dose of 13, 23 is still recommended as second option 1 year later

## 2022-11-13 NOTE — Assessment & Plan Note (Signed)
CPE completed today. Review of HM activities and recommendations discussed and provided on AVS. Anticipatory guidance, diet, and exercise recommendations provided. Medications, allergies, and hx reviewed and updated as necessary. Orders placed as listed below.  Plan: - Labs ordered. Will make changes as necessary based on results. One time HIV/HepC screen completed. - Shingles vaccine given - I will review these results and send recommendations via MyChart or a telephone call.  - F/U with CPE in 1 year or sooner for acute/chronic health needs as directed.

## 2022-11-13 NOTE — Patient Instructions (Addendum)
Lab Results or Imaging We will contact you if there are concerns with your results or changes need to be made. If there are new concerns that require significant changes, we will call you to schedule an appointment.   If all labs are considered normal, you will see your results in MyChart without specific comments.  I will respond to imaging as soon as possible. Some results take longer than others. Some results require consultation that may take extra time.   If you have additional questions about your labs or imaging, please feel free to set up a virtual appointment to discuss this further.   MyChart Messages You are welcome to send questions through MyChart. These will be triaged in accordance to urgency and responded to as quickly as possible.   Do not send urgent or emergency concerns through MyChart. I am seeing patients through the day and I am unable to monitor these. If you have a question about the urgency, please call the office.   Due to high volumes, MyChart messages may take up to 7 business days at this time.   If you have new concerns, changes in a current problem, or an ongoing issue that is not resolving, please call the office to schedule an appointment. I am required to bill MyChart messages with complex or new requests.

## 2022-11-14 LAB — CBC WITH DIFFERENTIAL/PLATELET
Basophils Absolute: 0.1 10*3/uL (ref 0.0–0.2)
Basos: 1 %
EOS (ABSOLUTE): 0.2 10*3/uL (ref 0.0–0.4)
Eos: 3 %
Hematocrit: 43.2 % (ref 34.0–46.6)
Hemoglobin: 13.7 g/dL (ref 11.1–15.9)
Immature Grans (Abs): 0 10*3/uL (ref 0.0–0.1)
Immature Granulocytes: 0 %
Lymphocytes Absolute: 2.1 10*3/uL (ref 0.7–3.1)
Lymphs: 32 %
MCH: 29.1 pg (ref 26.6–33.0)
MCHC: 31.7 g/dL (ref 31.5–35.7)
MCV: 92 fL (ref 79–97)
Monocytes Absolute: 0.4 10*3/uL (ref 0.1–0.9)
Monocytes: 6 %
Neutrophils Absolute: 3.7 10*3/uL (ref 1.4–7.0)
Neutrophils: 58 %
Platelets: 330 10*3/uL (ref 150–450)
RBC: 4.71 x10E6/uL (ref 3.77–5.28)
RDW: 12.4 % (ref 11.7–15.4)
WBC: 6.5 10*3/uL (ref 3.4–10.8)

## 2022-11-14 LAB — COMPREHENSIVE METABOLIC PANEL
ALT: 15 IU/L (ref 0–32)
AST: 14 IU/L (ref 0–40)
Albumin: 4.2 g/dL (ref 3.9–4.9)
Alkaline Phosphatase: 83 IU/L (ref 44–121)
BUN/Creatinine Ratio: 17 (ref 9–23)
BUN: 12 mg/dL (ref 6–24)
Bilirubin Total: 0.7 mg/dL (ref 0.0–1.2)
CO2: 23 mmol/L (ref 20–29)
Calcium: 9 mg/dL (ref 8.7–10.2)
Chloride: 107 mmol/L — ABNORMAL HIGH (ref 96–106)
Creatinine, Ser: 0.7 mg/dL (ref 0.57–1.00)
Globulin, Total: 2.3 g/dL (ref 1.5–4.5)
Glucose: 91 mg/dL (ref 70–99)
Potassium: 4.5 mmol/L (ref 3.5–5.2)
Sodium: 142 mmol/L (ref 134–144)
Total Protein: 6.5 g/dL (ref 6.0–8.5)
eGFR: 105 mL/min/{1.73_m2} (ref >59)

## 2022-11-14 LAB — HEPATITIS C ANTIBODY: Hep C Virus Ab: NONREACTIVE

## 2022-11-14 LAB — LIPID PANEL
Chol/HDL Ratio: 3.5 ratio (ref 0.0–4.4)
Cholesterol, Total: 195 mg/dL (ref 100–199)
HDL: 56 mg/dL (ref >39)
LDL Chol Calc (NIH): 125 mg/dL — ABNORMAL HIGH (ref 0–99)
Triglycerides: 74 mg/dL (ref 0–149)
VLDL Cholesterol Cal: 14 mg/dL (ref 5–40)

## 2022-11-14 LAB — HIV ANTIBODY (ROUTINE TESTING W REFLEX): HIV Screen 4th Generation wRfx: NONREACTIVE

## 2023-03-12 ENCOUNTER — Ambulatory Visit (INDEPENDENT_AMBULATORY_CARE_PROVIDER_SITE_OTHER): Payer: 59 | Admitting: Family Medicine

## 2023-03-12 ENCOUNTER — Encounter: Payer: Self-pay | Admitting: Family Medicine

## 2023-03-12 ENCOUNTER — Other Ambulatory Visit (HOSPITAL_BASED_OUTPATIENT_CLINIC_OR_DEPARTMENT_OTHER): Payer: Self-pay

## 2023-03-12 VITALS — BP 124/80 | HR 77 | Temp 98.0°F | Wt 168.0 lb

## 2023-03-12 DIAGNOSIS — J209 Acute bronchitis, unspecified: Secondary | ICD-10-CM | POA: Diagnosis not present

## 2023-03-12 MED ORDER — AZITHROMYCIN 500 MG PO TABS
500.0000 mg | ORAL_TABLET | Freq: Every day | ORAL | 0 refills | Status: DC
Start: 2023-03-12 — End: 2023-05-12
  Filled 2023-03-12: qty 3, 3d supply, fill #0

## 2023-03-12 MED ORDER — BENZONATATE 200 MG PO CAPS
200.0000 mg | ORAL_CAPSULE | Freq: Two times a day (BID) | ORAL | 0 refills | Status: DC | PRN
Start: 2023-03-12 — End: 2023-11-19
  Filled 2023-03-12: qty 20, 10d supply, fill #0

## 2023-03-12 NOTE — Progress Notes (Signed)
   Subjective:    Patient ID: Victoria Garrison, female    DOB: 02-07-1971, 52 y.o.   MRN: [id number]  HPI She complains of a 1 week history of sore throat and cough but no fever, chills, earache but is having some postnasal drainage and rhinorrhea.  She did COVID test about 3 days ago which was negative.  She does not smoke and has no underlying allergies.   Review of Systems     Objective:    Physical Exam Alert and in no distress. Tympanic membranes and canals are normal. Pharyngeal area is normal. Neck is supple without adenopathy or thyromegaly. Cardiac exam shows a regular sinus rhythm without murmurs or gallops. Lungs are clear to auscultation.        Assessment & Plan:  Acute bronchitis, unspecified organism - Plan: azithromycin (ZITHROMAX) 500 MG tablet, benzonatate (TESSALON) 200 MG capsule She is to call if  continued difficulty after a week.  Also cautioned to wear a mask while around other people

## 2023-05-12 ENCOUNTER — Ambulatory Visit: Payer: 59 | Admitting: Family Medicine

## 2023-05-12 ENCOUNTER — Other Ambulatory Visit (HOSPITAL_BASED_OUTPATIENT_CLINIC_OR_DEPARTMENT_OTHER): Payer: Self-pay

## 2023-05-12 VITALS — BP 120/70 | HR 103 | Temp 99.0°F | Ht 62.5 in | Wt 172.0 lb

## 2023-05-12 DIAGNOSIS — M791 Myalgia, unspecified site: Secondary | ICD-10-CM

## 2023-05-12 DIAGNOSIS — R6889 Other general symptoms and signs: Secondary | ICD-10-CM | POA: Insufficient documentation

## 2023-05-12 LAB — URINALYSIS, ROUTINE W REFLEX MICROSCOPIC
Bacteria, UA: NONE SEEN /[HPF]
Bilirubin Urine: NEGATIVE
Glucose, UA: NEGATIVE
Hyaline Cast: NONE SEEN /[LPF]
Ketones, ur: NEGATIVE
Leukocytes,Ua: NEGATIVE
Nitrite: NEGATIVE
Protein, ur: NEGATIVE
Specific Gravity, Urine: 1.01 (ref 1.001–1.035)
WBC, UA: NONE SEEN /[HPF] (ref 0–5)
pH: 7 (ref 5.0–8.0)

## 2023-05-12 LAB — MICROSCOPIC MESSAGE

## 2023-05-12 MED ORDER — HYDROCODONE BIT-HOMATROP MBR 5-1.5 MG/5ML PO SOLN
ORAL | 0 refills | Status: DC
Start: 2023-05-12 — End: 2023-05-12

## 2023-05-12 MED ORDER — HYDROCODONE BIT-HOMATROP MBR 5-1.5 MG/5ML PO SOLN
ORAL | 0 refills | Status: DC
Start: 2023-05-12 — End: 2023-07-14
  Filled 2023-05-12: qty 120, 24d supply, fill #0

## 2023-05-12 NOTE — Progress Notes (Signed)
 Patient Office Visit  Assessment & Plan:  Flu-like symptoms -     SARS-CoV-2 RNA, Influenza A/B, and RSV RNA, Qualitative NAAT -     HYDROcodone Bit-Homatrop MBr; 5 ml at night time prn cough  Dispense: 120 mL; Refill: 0  Muscle pain -     Urinalysis, Routine w reflex microscopic   UA negative other than trace blood.  Follow-up on respiratory panel.  Hycodan syrup to use at nighttime.  Patient does have Tessalon Perles at home.  Patient can also use over-the-counter Flonase.  If no improvement or worsening symptoms she is to notify us. Return if symptoms worsen or fail to improve.   Subjective:    Patient ID: Victoria Garrison, female    DOB: [birth year]  Age: 53 y.o. MRN: [id number]  Chief Complaint  Patient presents with   Muscle Pain    Midline of back, knee pain x 1 day.   Nasal Congestion    Cough, low grade fever x 1 day.    Muscle Pain  Flu like symptoms- pt has been coughing, congestion, temp started last night. Pt has had muscle soreness over mid back area and behind knees. Sore throat last night and Temp 100.1 last night. Not having SOB or wheezing. No tobacco use. Took Advil this AM 40 minutes ago. Pt did get flu shot this season. 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 Pt does not have history of recurrent UTIs. NO gross hematura, no nausea or vomiting.    The 10-year ASCVD risk score (Arnett DK, et al., 2019) is: 1.1%  Past Medical History:  Diagnosis Date   Abnormal Pap smear    Anemia    Due to heavy menustral cycles   Dysmenorrhea    Fibroadenoma of left breast    Fibroids 09/02/2012   HSV-1 (herpes simplex virus 1) infection    Menorrhagia    Vaginal Pap smear, abnormal    Past Surgical History:  Procedure Laterality Date   CHOLECYSTECTOMY  03/17/2012   DILITATION & CURRETTAGE/HYSTROSCOPY WITH THERMACHOICE ABLATION N/A 11/04/2012   Procedure: HYSTEROSCOPY; DILATATION AND CURETTAGE;THERMACHOICE ENDOMETRIAL ABLATION (Total Therapy  Time=9 min 18sec);  Surgeon: Tilda Burrow, MD;  Location: [facility];  Service: Gynecology;  Laterality: N/A;   VAGINAL DELIVERY     3   Social History   Tobacco Use   Smoking status: Never   Smokeless tobacco: Never  Vaping Use   Vaping status: Never Used  Substance Use Topics   Alcohol use: Yes    Alcohol/week: 1.0 standard drink of alcohol    Types: 1 Glasses of wine per week    Comment: occassionaly   Drug use: No   Family History  Problem Relation Age of Onset   COPD Mother    Heart disease Mother    Diabetes Mother    Hypertension Mother    Cancer Father        basal cell skin   Throat cancer Father    Stroke Father    Cancer Maternal Aunt        breast   CAD Maternal Aunt    Breast cancer Maternal Aunt    CAD Paternal Grandmother    Cancer Paternal Grandmother        breast   Breast cancer Paternal Grandmother    Allergies  Allergen Reactions   Guaifenesin     Hives     ROS    Objective:    BP 120/70  Pulse (!) 103   Temp 99 F (37.2 C)   Ht 5' 2.5" (1.588 m)   Wt 172 lb (78 kg)   SpO2 99%   BMI 30.96 kg/m  BP Readings from Last 3 Encounters:  05/12/23 120/70  03/12/23 124/80  11/13/22 124/82   Wt Readings from Last 3 Encounters:  05/12/23 172 lb (78 kg)  03/12/23 168 lb (76.2 kg)  11/13/22 167 lb 9.6 oz (76 kg)    Physical Exam Vitals and nursing note reviewed.  Constitutional:      Appearance: Normal appearance.  HENT:     Head: Normocephalic.     Right Ear: Tympanic membrane, ear canal and external ear normal.     Left Ear: Tympanic membrane, ear canal and external ear normal.     Nose: Congestion present.     Right Sinus: No maxillary sinus tenderness.     Left Sinus: No maxillary sinus tenderness.     Mouth/Throat:     Pharynx: No oropharyngeal exudate or posterior oropharyngeal erythema.     Comments: Postnasal drip Eyes:     Extraocular Movements: Extraocular movements intact.     Conjunctiva/sclera: Conjunctivae  normal.     Pupils: Pupils are equal, round, and reactive to light.  Cardiovascular:     Rate and Rhythm: Regular rhythm. Tachycardia present.     Heart sounds: Normal heart sounds.  Pulmonary:     Effort: Pulmonary effort is normal.     Breath sounds: Normal breath sounds. No wheezing or rhonchi.  Musculoskeletal:     Right lower leg: No edema.     Left lower leg: No edema.  Neurological:     General: No focal deficit present.     Mental Status: She is alert and oriented to person, place, and time.      No results found for any visits on 05/12/23.

## 2023-05-12 NOTE — Addendum Note (Signed)
 Addended by: Angelica Chessman on: 05/12/2023 04:27 PM   Modules accepted: Orders

## 2023-05-13 ENCOUNTER — Telehealth: Payer: Self-pay

## 2023-05-13 ENCOUNTER — Other Ambulatory Visit (HOSPITAL_BASED_OUTPATIENT_CLINIC_OR_DEPARTMENT_OTHER): Payer: Self-pay

## 2023-05-13 ENCOUNTER — Other Ambulatory Visit: Payer: 59

## 2023-05-13 ENCOUNTER — Other Ambulatory Visit: Payer: Self-pay | Admitting: Family Medicine

## 2023-05-13 DIAGNOSIS — M791 Myalgia, unspecified site: Secondary | ICD-10-CM | POA: Diagnosis not present

## 2023-05-13 DIAGNOSIS — R6889 Other general symptoms and signs: Secondary | ICD-10-CM | POA: Diagnosis not present

## 2023-05-13 DIAGNOSIS — J101 Influenza due to other identified influenza virus with other respiratory manifestations: Secondary | ICD-10-CM

## 2023-05-13 LAB — INFLUENZA A AND B AG, IMMUNOASSAY
INFLUENZA A ANTIGEN: DETECTED — AB
INFLUENZA B ANTIGEN: NOT DETECTED

## 2023-05-13 LAB — SARS-COV-2 RNA, INFLUENZA A/B, AND RSV RNA, QUALITATIVE NAAT
INFLUENZA A RNA: DETECTED — AB
INFLUENZA B RNA: NOT DETECTED
RSV RNA: NOT DETECTED
SARS COV2 RNA: NOT DETECTED

## 2023-05-13 MED ORDER — OSELTAMIVIR PHOSPHATE 75 MG PO CAPS
75.0000 mg | ORAL_CAPSULE | Freq: Two times a day (BID) | ORAL | 0 refills | Status: DC
  Filled 2023-05-13: qty 10, 5d supply, fill #0

## 2023-05-13 NOTE — Telephone Encounter (Signed)
 Paitent had positive flu test in office today. Pt with cough/congestion/body aches that started on Monday. Pt asks if Tamiflu can be sent to [facility]? Thanks.

## 2023-05-14 ENCOUNTER — Encounter: Payer: Self-pay | Admitting: Family Medicine

## 2023-07-14 ENCOUNTER — Encounter: Payer: Self-pay | Admitting: Adult Health

## 2023-07-14 ENCOUNTER — Ambulatory Visit (INDEPENDENT_AMBULATORY_CARE_PROVIDER_SITE_OTHER): Admitting: Adult Health

## 2023-07-14 VITALS — BP 157/87 | HR 91 | Ht 62.0 in | Wt 172.0 lb

## 2023-07-14 DIAGNOSIS — N950 Postmenopausal bleeding: Secondary | ICD-10-CM | POA: Insufficient documentation

## 2023-07-14 DIAGNOSIS — R030 Elevated blood-pressure reading, without diagnosis of hypertension: Secondary | ICD-10-CM | POA: Insufficient documentation

## 2023-07-14 NOTE — Progress Notes (Signed)
  Subjective:     Patient ID: Victoria Garrison, female   DOB: 25-Oct-1970, 53 y.o.   MRN: [id number]  HPI Victoria Garrison is a 53 year old white female, married, PM in complaining of vaginal bleeding that started Saturday, was light pink then brown and had some cramping. She said about 2 weeks ago, breast very sore and had some pain LLQ and discomfort low stomach after sex. Last time saw blood was December 2023 for 1 day bright red, had ablation 2014.     Component Value Date/Time   DIAGPAP  05/07/2021 0906    - Negative for intraepithelial lesion or malignancy (NILM)   DIAGPAP  05/05/2018 0000    NEGATIVE FOR INTRAEPITHELIAL LESIONS OR MALIGNANCY.   HPVHIGH Negative 05/07/2021 0906   ADEQPAP  05/07/2021 0906    Satisfactory for evaluation; transformation zone component ABSENT.   ADEQPAP  05/05/2018 0000    Satisfactory for evaluation  endocervical/transformation zone component ABSENT.    PCP is Archibald Beard NP  Review of Systems + vaginal bleeding that started Saturday, was light pink then brown and had some cramping. She said about 2 weeks ago, breast very sore and had some pain LLQ and discomfort low stomach after sex. Last time saw blood was December 2023 for 1 day bright red, had ablation 2014.   Reviewed past medical,surgical, social and family history. Reviewed medications and allergies.  Objective:   Physical Exam BP (!) 157/87 (BP Location: Left Arm, Patient Position: Sitting, Cuff Size: Normal)   Pulse 91   Ht 5\' 2"  (1.575 m)   Wt 172 lb (78 kg)   BMI 31.46 kg/m     Skin warm and dry.Pelvic: external genitalia is normal in appearance no lesions, vagina: light brown to tan discharge, no odor,urethra has no lesions or masses noted, cervix:smooth, uterus: normal size, shape and contour, non tender, no masses felt, adnexa: no masses or tenderness noted. Bladder is non tender and no masses felt. Fall risk is low  Upstream - 07/14/23 1210       Pregnancy Intention Screening   Does the  patient want to become pregnant in the next year? N/A    Does the patient's partner want to become pregnant in the next year? N/A    Would the patient like to discuss contraceptive options today? N/A      Contraception Wrap Up   Current Method Vasectomy   ablation   End Method Vasectomy   ablation   Contraception Counseling Provided No            Examination chaperoned by Alphonso Aschoff LPn Assessment:     1. PMB (postmenopausal bleeding) (Primary) + vaginal bleeding that started Saturday, was light pink then brown and had some cramping. She said about 2 weeks ago, breast very sore and had some pain LLQ and discomfort low stomach after sex. Last time saw blood was December 2023 for 1 day bright red, had ablation 2014. Will get pelvic US  in office as soon as have appt to assess uterine lining, she is aware of thickened will need biopsy  Will check FSH too to see if PM - US  PELVIC COMPLETE WITH TRANSVAGINAL; Future - Follicle stimulating hormone  2. Elevated BP without diagnosis of hypertension Keep check on BP      Plan:     Return 07/27/23 for pelvic US  in office

## 2023-07-15 DIAGNOSIS — N950 Postmenopausal bleeding: Secondary | ICD-10-CM | POA: Diagnosis not present

## 2023-07-16 LAB — FOLLICLE STIMULATING HORMONE: FSH: 44.7 m[IU]/mL

## 2023-07-27 ENCOUNTER — Ambulatory Visit: Admitting: Radiology

## 2023-07-27 DIAGNOSIS — N950 Postmenopausal bleeding: Secondary | ICD-10-CM

## 2023-07-27 NOTE — Progress Notes (Signed)
 GYN US : TA and TV imaging performed - vinyl probe cover used - Chaperone: Peggy Midplane Anteverted uterus normal in size, several intramural fibroids <=81mm, myometrium overall heterogeneous. Endom thickness = 2.70mm, avascular cavity and canal, Hx of Em Ablation in 2014, no evidence of intracavitary defects Nl ov's, mobile, Adjacent to the Rt ovary is small simple smoothly marginated avascular cyst = 11 x 10 mm.  Appears separate from the Rt ovary. ? Right paratubal cyst.  neg Left adnexal region, neg CDS, no free fluid present

## 2023-08-13 ENCOUNTER — Ambulatory Visit: Admitting: Family Medicine

## 2023-08-13 ENCOUNTER — Encounter: Payer: Self-pay | Admitting: Family Medicine

## 2023-08-13 DIAGNOSIS — A6920 Lyme disease, unspecified: Secondary | ICD-10-CM | POA: Insufficient documentation

## 2023-08-13 MED ORDER — DOXYCYCLINE HYCLATE 100 MG PO TABS: 100.0000 mg | ORAL_TABLET | Freq: Two times a day (BID) | ORAL | 0 refills | Status: AC

## 2023-08-13 NOTE — Assessment & Plan Note (Signed)
 Classic erythema migrans rash at left inner thigh, two rings approx 3-6cm in diameter. Unidentified insect. Will treat with Doxycycline  100mg  BID x10 days. Seek medical care for worsening symptoms including fatigue, fever, chills, headache, body aches, nausea, vomiting, anorexia, neck stiffness.

## 2023-08-13 NOTE — Progress Notes (Signed)
 Subjective:  HPI: Victoria Garrison is a 53 y.o. female presenting on 08/13/2023 for No chief complaint on file.   HPI Patient is in today for rash to her left inner thigh after getting bit by an unknown insect last night. She felt several bites but did not visualize the insect and woke up this morning with two circular lesions to her thigh. These are increasing in size. No drainage. Denies fever, chills, body aches, headache, nausea, vomiting.  Review of Systems  All other systems reviewed and are negative.   Relevant past medical history reviewed and updated as indicated.   Past Medical History:  Diagnosis Date   Abnormal Pap smear    Anemia    Due to heavy menustral cycles   Dysmenorrhea    Fibroadenoma of left breast    Fibroids 09/02/2012   HSV-1 (herpes simplex virus 1) infection    Menorrhagia    Vaginal Pap smear, abnormal      Past Surgical History:  Procedure Laterality Date   CHOLECYSTECTOMY  03/17/2012   DILITATION & CURRETTAGE/HYSTROSCOPY WITH THERMACHOICE ABLATION N/A 11/04/2012   Procedure: HYSTEROSCOPY; DILATATION AND CURETTAGE;THERMACHOICE ENDOMETRIAL ABLATION (Total Therapy Time=9 min 18sec);  Surgeon: Albino Hum, MD;  Location: [facility];  Service: Gynecology;  Laterality: N/A;   VAGINAL DELIVERY     3    Allergies and medications reviewed and updated.   Current Outpatient Medications:    doxycycline  (VIBRA -TABS) 100 MG tablet, Take 1 tablet (100 mg total) by mouth 2 (two) times daily for 10 days., Disp: 20 tablet, Rfl: 0   benzonatate  (TESSALON ) 200 MG capsule, Take 1 capsule (200 mg total) by mouth 2 (two) times daily as needed for cough., Disp: 20 capsule, Rfl: 0   ibuprofen (ADVIL) 200 MG tablet, Take 200 mg by mouth every 6 (six) hours as needed., Disp: , Rfl:   Allergies  Allergen Reactions   Guaifenesin     Hives     Objective:   There were no vitals taken for this visit.     07/14/2023   12:26 PM 07/14/2023   12:08 PM 05/12/2023     9:01 AM  Vitals with BMI  Height  5\' 2"  5' 2.5"  Weight  172 lbs 172 lbs  BMI  31.45 30.94  Systolic 157 158 914  Diastolic 87 85 70  Pulse 91 91 103     Physical Exam Vitals and nursing note reviewed.  Constitutional:      Appearance: Normal appearance. She is normal weight.  HENT:     Head: Normocephalic and atraumatic.  Skin:    General: Skin is warm and dry.     Findings: Erythema present.          Comments: 2 circular erythematous lesions with central clearing and erythema at puncture  Neurological:     General: No focal deficit present.     Mental Status: She is alert and oriented to person, place, and time. Mental status is at baseline.  Psychiatric:        Mood and Affect: Mood normal.        Behavior: Behavior normal.        Thought Content: Thought content normal.        Judgment: Judgment normal.     Assessment & Plan:  Erythema migrans (Lyme disease) Assessment & Plan: Classic erythema migrans rash at left inner thigh, two rings approx 3-6cm in diameter. Unidentified insect. Will treat with Doxycycline  100mg  BID x10 days. Seek  medical care for worsening symptoms including fatigue, fever, chills, headache, body aches, nausea, vomiting, anorexia, neck stiffness.    Other orders -     Doxycycline  Hyclate; Take 1 tablet (100 mg total) by mouth 2 (two) times daily for 10 days.  Dispense: 20 tablet; Refill: 0     Follow up plan: Return if symptoms worsen or fail to improve.  Jenelle Mis, FNP

## 2023-09-15 ENCOUNTER — Other Ambulatory Visit: Payer: Self-pay

## 2023-09-15 DIAGNOSIS — Z1231 Encounter for screening mammogram for malignant neoplasm of breast: Secondary | ICD-10-CM

## 2023-10-26 ENCOUNTER — Encounter

## 2023-10-26 ENCOUNTER — Encounter: Payer: Self-pay | Admitting: Family Medicine

## 2023-10-26 ENCOUNTER — Ambulatory Visit: Payer: Self-pay

## 2023-10-26 ENCOUNTER — Ambulatory Visit: Admitting: Family Medicine

## 2023-10-26 VITALS — BP 138/72 | HR 86 | Temp 98.9°F | Ht 62.0 in | Wt 173.0 lb

## 2023-10-26 DIAGNOSIS — T63481A Toxic effect of venom of other arthropod, accidental (unintentional), initial encounter: Secondary | ICD-10-CM | POA: Diagnosis not present

## 2023-10-26 DIAGNOSIS — Z1231 Encounter for screening mammogram for malignant neoplasm of breast: Secondary | ICD-10-CM

## 2023-10-26 NOTE — Telephone Encounter (Signed)
 FYI Only or Action Required?: FYI only for provider.  Patient was last seen in primary care on 08/13/2023 by Kayla Jeoffrey RAMAN, FNP.  Called Nurse Triage reporting Insect Bite.  Symptoms began yesterday.  Interventions attempted: Ice/heat application.  Symptoms are: gradually worsening.  Triage Disposition: See Physician Within 24 Hours  Patient/caregiver understands and will follow disposition?: Yes  Copied from CRM #[id number]. Topic: Clinical - Red Word Triage >> Oct 26, 2023 11:19 AM Graeme ORN wrote: Red Word that prompted transfer to Nurse Triage: Bug bite swollen painful to touch.    ----------------------------------------------------------------------- From previous Reason for Contact - Scheduling: Patient/patient representative is calling to schedule an appointment. Refer to attachments for appointment information. Reason for Disposition  [1] Red or very tender (to touch) area AND [2] started over 24 hours after the bite  Answer Assessment - Initial Assessment Questions 1. TYPE of INSECT: What type of insect was it?      Unknown black flying insect 2. ONSET: When did you get bitten?      10/25/23 3. LOCATION: Where is the insect bite located?      Right arm at elbow 4. REDNESS: Is the area red or pink? If Yes, ask: What size is the area of redness? (inches or cm). When did the redness start?     yes 5. PAIN: Is there any pain? If Yes, ask: How bad is the pain? (Scale 0-10; or none, mild, moderate, severe)     mild 6. ITCHING: Does it itch? If Yes, ask: How bad is the itch?      yes 7. SWELLING: How big is the swelling? (e.g., inches, cm, or compare to coins)     yes 8. OTHER SYMPTOMS: Do you have any other symptoms?  (e.g., difficulty breathing, fever, hives)     Denies  Additional info: Cleansed with alcohol. Using ice now. No other treatments.  Protocols used: Insect Bite-A-AH

## 2023-10-26 NOTE — Progress Notes (Addendum)
 Chief Complaint  Patient presents with   other    Bug bite on rt arm on the inside, red and hot, aches, black spider   Victoria Garrison is a 53 year old female who presents with a reaction to a bug bite.  She was bitten by a flying bug, larger than a mosquito, yesterday evening. It did not look like a tick. She flicked it off, it flew away.  It was still stinging, but she didn't see anything on the skin. The bite area expanded significantly by the next morning. She marked the area with a pen at 9 AM, and it has grown since then. The area is red, itchy, and aching. Ice was applied twice without relief of itching, though the redness appears less than this morning.  She denies fever, chills, nausea, vomiting, diarrhea, or body aches, except for the arm where the bite is located. She has not taken any antihistamines.    PMH, PSH, SH reviewed  Outpatient Encounter Medications as of 10/26/2023  Medication Sig Note   ibuprofen (ADVIL) 200 MG tablet Take 200 mg by mouth every 6 (six) hours as needed. 11/13/2022: prn   benzonatate  (TESSALON ) 200 MG capsule Take 1 capsule (200 mg total) by mouth 2 (two) times daily as needed for cough. (Patient not taking: Reported on 10/26/2023)    No facility-administered encounter medications on file as of 10/26/2023.   Allergies  Allergen Reactions   Guaifenesin     Hives     ROS: no f/c, n/v/d, myalgias. No URI symptoms, CP, SOB or other concerns other than noted in HPI.    PHYSICAL EXAM:  BP 138/72   Pulse 86   Ht 5' 2 (1.575 m)   Wt 173 lb (78.5 kg)   SpO2 96%   BMI 31.64 kg/m   Pleasant female in no distress HEENT: conjunctiva and sclera are clear, EOMI Neck: no lymphadenopathy RUE:   Anterior forearm--13 x 13.5-15 cm of erythema, pink, warm, slightly raised. Very small raised area present somewhat off center (more towards the proximal part of the arm than the center of the current area of redness). This area is palpable, but no visible  abnormality noted.  (Pen marking from this morning measured 8.75 x 10 cm)  There is also a small area of erythema separate from the large area, in the antecubital fossa, nondistinct, pink. No streaks. No axillary lymphadenopathy. Neuro: alert and oriented, cranial nerves grossly intact, normal gait Psych: normal mood, affect, hygiene and grooming       ASSESSMENT/PLAN:  Local reaction to insect sting, accidental or unintentional, initial encounter   Local reaction to insect bite/sting, right arm Local reaction to insect bite on right arm, erythema 13-15 cm, itchy, no systemic symptoms. Rapid onset suggests allergy over cellulitis. No fever or streaking. Not consistent with erythema migrans. - Advise Zyrtec, Claritin, or Allegra during the day. - Recommend Benadryl at night for itching and sleep, if needed. - Instructed to use ice or cool compresses for swelling and erythema, arm elevation prn swelling - Monitor for infection signs: increased pain, fever, streaking. - Advise contact via MyChart for significant changes or concerns, including sending pictures. - Discuss potential doxycycline  use if infection signs develop, caution sun sensitivity.

## 2023-10-26 NOTE — Patient Instructions (Signed)
 ALLERGIC REACTION TO INSECT BITE: You have an allergic/local reaction to an insect bite on your right arm. The area is red, itchy, and about 13-15 cm in size. -Take Zyrtec, Claritin, or Allegra during the day to help with the itching and swelling. -Take Benadryl at night to help with itching and to aid sleep, if needed -Use ice or cool compresses to reduce swelling and redness. -Monitor the area for signs of infection, such as increased pain, fever, or red streaking up the arm. -Contact us  via MyChart if you notice significant changes or have concerns. You can also send pictures of the affected area. -If signs of infection develop, we may consider using doxycycline , but be cautious of sun sensitivity while on this medication.

## 2023-10-28 ENCOUNTER — Ambulatory Visit
Admission: RE | Admit: 2023-10-28 | Discharge: 2023-10-28 | Disposition: A | Discharge status: Home / Self Care | Source: Ambulatory Visit | Attending: Nurse Practitioner | Admitting: Nurse Practitioner

## 2023-10-28 DIAGNOSIS — Z1231 Encounter for screening mammogram for malignant neoplasm of breast: Secondary | ICD-10-CM

## 2023-11-03 ENCOUNTER — Ambulatory Visit: Payer: Self-pay | Admitting: Nurse Practitioner

## 2023-11-18 NOTE — Progress Notes (Unsigned)
 Shingrix: Pneumonia: T-dap: Flu shot:   Victoria Doing, DNP, AGNP-c [facility] [address] [place], [place] [address] Main Office [phone] VISIT TYPE: CPE on 11/19/2023 Today's Vitals   11/19/23 0941  BP: 124/82  Pulse: 78  Weight: 174 lb 12.8 oz (79.3 kg)  Height: 5' 2 (1.575 m)   Body mass index is 31.97 kg/m. BP 124/82   Pulse 78   Ht 5' 2 (1.575 m)   Wt 174 lb 12.8 oz (79.3 kg)   BMI 31.97 kg/m   Subjective:    Patient ID: Victoria Garrison, female    DOB: Jan 31, 1971, 53 y.o.   MRN: [id number]  HPI: History of Present Illness Victoria Garrison is a 53 year old female who presents for an annual physical exam.  She reports experiencing grief and emotional difficulty following the unexpected passing of her mother seven weeks ago. Her mother had broken her hip on Mother's Day and subsequently became depressed due to limited mobility. Her health had been deteriorating since that time until her passing. She was present with her family at the time of her mother's passing.  She is anxious about an upcoming trip to [facility] to visit the area with her son, who is starting veterinary school. She is particularly concerned about flying, as she has not flown frequently.  She reports swelling in her feet and ankles, primarily occurring at the end of the day and resolving by morning. She recalls a history of elevated blood pressure during a previous visit to the OB-GYN, which she attributes to stress and pain at the time. She prefers manual blood pressure readings over electronic ones due to anxiety with the latter.  She has a history of uterine ablation in 2014 and experienced some bleeding earlier this year. An ultrasound revealed a fibroid, but no significant uterine wall thickening was noted. No changes in bowel habits, chest pain, shortness of breath, or dizziness.  She is working on [employer], noting a recent weight of 174.8 pounds. She plans to  reduce her sweet tea intake and focus on portion control and protein intake. She has started walking with her daughter as part of her exercise routine.   Pertinent items are noted in HPI.  Most Recent Depression Screen:     11/19/2023    9:39 AM 11/13/2022    9:54 AM 10/16/2021   10:53 AM 06/12/2021    9:46 AM 05/07/2021    8:37 AM  Depression screen PHQ 2/9  Decreased Interest 0 0 0 0 0  Down, Depressed, Hopeless 0 0 0 0 0  PHQ - 2 Score 0 0 0 0 0  Altered sleeping   0  0  Tired, decreased energy   0  0  Change in appetite   0  0  Feeling bad or failure about yourself    0  0  Trouble concentrating   0  0  Moving slowly or fidgety/restless   0  0  Suicidal thoughts   0  0  PHQ-9 Score   0  0  Difficult Garrison work/chores   Not difficult at all     Most Recent Anxiety Screen:     05/07/2021    8:37 AM 09/10/2020    9:40 AM  GAD 7 : Generalized Anxiety Score  Nervous, Anxious, on Edge 0 0  Control/stop worrying 0 0  Worry too much - different things 0 0  Trouble relaxing 0 1  Restless 0 0  Easily annoyed  or irritable 0 0  Afraid - awful might happen 0 0  Total GAD 7 Score 0 1   Most Recent Fall Screen:    11/19/2023    9:39 AM 07/14/2023   12:10 PM 11/13/2022    9:54 AM 10/16/2021   10:53 AM 06/12/2021    9:46 AM  Fall Risk   Falls in the past year? 0 0 0 0 0  Number falls in past yr: 0 0 0 0 0  Injury with Fall? 0 0 0 0 0  Risk for fall due to : No Fall Risks  No Fall Risks No Fall Risks No Fall Risks  Follow up Falls evaluation completed  Falls evaluation completed Falls evaluation completed  Falls evaluation completed      Data saved with a previous flowsheet row definition    Past medical history, surgical history, medications, allergies, family history and social history reviewed with patient today and changes made to appropriate areas of the chart.  Past Medical History:  Past Medical History:  Diagnosis Date   Abnormal Pap smear    Anemia    Due to heavy  menustral cycles   Dysmenorrhea    Fibroadenoma of left breast    Fibroids 09/02/2012   HSV-1 (herpes simplex virus 1) infection    Menorrhagia    Vaginal Pap smear, abnormal    Medications:  Current Outpatient Medications on File Prior to Visit  Medication Sig   ibuprofen (ADVIL) 200 MG tablet Take 200 mg by mouth every 6 (six) hours as needed.   No current facility-administered medications on file prior to visit.   Surgical History:  Past Surgical History:  Procedure Laterality Date   CHOLECYSTECTOMY  03/17/2012   DILITATION & CURRETTAGE/HYSTROSCOPY WITH THERMACHOICE ABLATION N/A 11/04/2012   Procedure: HYSTEROSCOPY; DILATATION AND CURETTAGE;THERMACHOICE ENDOMETRIAL ABLATION (Total Therapy Time=9 min 18sec);  Surgeon: Norleen LULLA Server, MD;  Location: [facility];  Service: Gynecology;  Laterality: N/A;   VAGINAL DELIVERY     3   Allergies:  Allergies  Allergen Reactions   Guaifenesin     Hives    Family History:  Family History  Problem Relation Age of Onset   COPD Mother    Heart disease Mother    Diabetes Mother    Hypertension Mother    Cancer Father        basal cell skin   Throat cancer Father    Stroke Father    CAD Maternal Aunt    Breast cancer Maternal Aunt    CAD Paternal Grandmother    Breast cancer Paternal Grandmother        Objective:    BP 124/82   Pulse 78   Ht 5' 2 (1.575 m)   Wt 174 lb 12.8 oz (79.3 kg)   BMI 31.97 kg/m   Wt Readings from Last 3 Encounters:  11/19/23 174 lb 12.8 oz (79.3 kg)  10/26/23 173 lb (78.5 kg)  07/14/23 172 lb (78 kg)    Physical Exam Vitals and nursing note reviewed.  Constitutional:      General: She is not in acute distress.    Appearance: Normal appearance.  HENT:     Head: Normocephalic and atraumatic.     Right Ear: Hearing, tympanic membrane, ear canal and external ear normal.     Left Ear: Hearing, tympanic membrane, ear canal and external ear normal.     Nose: Nose normal.     Right Sinus: No  maxillary sinus tenderness or frontal  sinus tenderness.     Left Sinus: No maxillary sinus tenderness or frontal sinus tenderness.     Mouth/Throat:     Lips: Pink.     Mouth: Mucous membranes are moist.     Pharynx: Oropharynx is clear.  Eyes:     General: Lids are normal. Vision grossly intact.     Extraocular Movements: Extraocular movements intact.     Conjunctiva/sclera: Conjunctivae normal.     Pupils: Pupils are equal, round, and reactive to light.     Funduscopic exam:    Right eye: Red reflex present.        Left eye: Red reflex present.    Visual Fields: Right eye visual fields normal and left eye visual fields normal.  Neck:     Thyroid : No thyromegaly.     Vascular: No carotid bruit.  Cardiovascular:     Rate and Rhythm: Normal rate and regular rhythm.     Chest Wall: PMI is not displaced.     Pulses: Normal pulses.          Dorsalis pedis pulses are 2+ on the right side and 2+ on the left side.       Posterior tibial pulses are 2+ on the right side and 2+ on the left side.     Heart sounds: Normal heart sounds. No murmur heard. Pulmonary:     Effort: Pulmonary effort is normal. No respiratory distress.     Breath sounds: Normal breath sounds.  Abdominal:     General: Abdomen is flat. Bowel sounds are normal. There is no distension.     Palpations: Abdomen is soft. There is no hepatomegaly, splenomegaly or mass.     Tenderness: There is no abdominal tenderness. There is no right CVA tenderness, left CVA tenderness, guarding or rebound.  Musculoskeletal:        General: Normal range of motion.     Cervical back: Full passive range of motion without pain, normal range of motion and neck supple. No tenderness.     Right lower leg: No edema.     Left lower leg: No edema.  Feet:     Left foot:     Toenail Condition: Left toenails are normal.  Lymphadenopathy:     Cervical: No cervical adenopathy.     Upper Body:     Right upper body: No supraclavicular adenopathy.      Left upper body: No supraclavicular adenopathy.  Skin:    General: Skin is warm and dry.     Capillary Refill: Capillary refill takes less than 2 seconds.     Nails: There is no clubbing.  Neurological:     General: No focal deficit present.     Mental Status: She is alert and oriented to person, place, and time.     GCS: GCS eye subscore is 4. GCS verbal subscore is 5. GCS motor subscore is 6.     Sensory: Sensation is intact. No sensory deficit.     Motor: Motor function is intact. No weakness.     Coordination: Coordination is intact. Coordination normal.     Gait: Gait is intact.     Deep Tendon Reflexes: Reflexes are normal and symmetric.  Psychiatric:        Attention and Perception: Attention normal.        Mood and Affect: Mood normal.        Speech: Speech normal.        Behavior: Behavior normal. Behavior is cooperative.  Thought Content: Thought content normal.        Cognition and Memory: Cognition and memory normal.        Judgment: Judgment normal.    Results for orders placed or performed in visit on 07/14/23  Follicle stimulating hormone   Collection Time: 07/15/23  2:26 PM  Result Value Ref Range   FSH 44.7 mIU/mL      Assessment & Plan:   Problem List Items Addressed This Visit     Uterine leiomyoma   Uterine fibroid identified earlier this year with sudden onset of vaginal bleeding. Evaluation with GYN completed. No current symptoms or concerns. Previous evaluation showed no need for hysterectomy. Recommend ongoing monitoring and close follow-up for any vaginal bleeding. She has a history of ablation, but hormone labs show she is post menopausal.       Encounter for annual physical exam - Primary   CPE completed today. Review of HM activities and recommendations discussed and provided on AVS. Anticipatory guidance, diet, and exercise recommendations provided. Medications, allergies, and hx reviewed and updated as necessary. Orders placed as listed  below.  Plan: - Labs ordered. Will make changes as necessary based on results.  - I will review these results and send recommendations via MyChart or a telephone call.  - F/U with CPE in 1 year or sooner for acute/chronic health needs as directed.  - Flu vaccine with work and Tdap with [facility]       Elevated LDL cholesterol level   Managed with diet and exercise. Will monitor today. Recommend increased physical activity and high fiber diet to best manage.       Relevant Orders   CMP14+EGFR   CBC with Differential/Platelet   COMPLETE METABOLIC PANEL WITHOUT GFR   Hemoglobin A1c   Lipid panel   Bereavement   Recent bereavement due to mother's passing. - Offer grief counseling services- she is considering [facility] services offered.  - Encourage reaching out if grieving process becomes overwhelming.      BMI 31.0-31.9,adult   Recent weight gain addressed with dietary and physical activity recommendations. - Encourage portion control and a diet high in protein and fiber, low in carbohydrates. - Encourage regular physical activity, such as walking.       Relevant Orders   CMP14+EGFR   CBC with Differential/Platelet   COMPLETE METABOLIC PANEL WITHOUT GFR   Hemoglobin A1c   Lipid panel   Other Visit Diagnoses       Screening for endocrine, nutritional, metabolic and immunity disorder       Relevant Orders   CMP14+EGFR   CBC with Differential/Platelet   COMPLETE METABOLIC PANEL WITHOUT GFR   Hemoglobin A1c   Lipid panel       Assessment and Plan Assessment & Plan       Follow up plan: Return in about 1 year (around 11/18/2024) for CPE.  NEXT PREVENTATIVE PHYSICAL DUE IN 1 YEAR.  PATIENT COUNSELING PROVIDED FOR ALL ADULT PATIENTS: A well balanced diet low in saturated fats, cholesterol, and moderation in carbohydrates.  This can be as simple as monitoring portion sizes and cutting back on sugary beverages such as soda and juice to start with.     Daily water consumption of at least 64 ounces.  Physical activity at least 180 minutes per week.  If just starting out, start 10 minutes a day and work your way up.   This can be as simple as taking the stairs instead of the elevator and  walking 2-3 laps around the office  purposefully every day.   STD protection, partner selection, and regular testing if high risk.  Limited consumption of alcoholic beverages if alcohol is consumed. For men, I recommend no more than 14 alcoholic beverages per week, spread out throughout the week (max 2 per day). Avoid binge drinking or consuming large quantities of alcohol in one setting.  Please let me know if you feel you may need help with reduction or quitting alcohol consumption.   Avoidance of nicotine, if used. Please let me know if you feel you may need help with reduction or quitting nicotine use.   Daily mental health attention. This can be in the form of 5 minute daily meditation, prayer, journaling, yoga, reflection, etc.  Purposeful attention to your emotions and mental state can significantly improve your overall wellbeing  and  Health.  Please know that I am here to help you with all of your health care goals and am happy to work with you to find a solution that works best for you.  The greatest advice I have received with any changes in life are to take it one step at a time, that even means if all you can focus on is the next 60 seconds, then do that and celebrate your victories.  With any changes in life, you will have set backs, and that is OK. The important thing to remember is, if you have a set back, it is not a failure, it is an opportunity to try again! Screening Testing Mammogram Every 1 -2 years based on history and risk factors Starting at age 74 Pap Smear Ages 21-39 every 3 years Ages 69-65 every 5 years with HPV testing More frequent testing may be required based on results and history Colon Cancer Screening Every  1-10 years based on test performed, risk factors, and history Starting at age 36 Bone Density Screening Every 2-10 years based on history Starting at age 87 for women Recommendations for men differ based on medication usage, history, and risk factors AAA Screening One time ultrasound Men 54-16 years old who have every smoked Lung Cancer Screening Low Dose Lung CT every 12 months Age 19-80 years with a 30 pack-year smoking history who still smoke or who have quit within the last 15 years   Screening Labs Routine  Labs: Complete Blood Count (CBC), Complete Metabolic Panel (CMP), Cholesterol (Lipid Panel) Every 6-12 months based on history and medications May be recommended more frequently based on current conditions or previous results Hemoglobin A1c Lab Every 3-12 months based on history and previous results Starting at age 78 or earlier with diagnosis of diabetes, high cholesterol, BMI >26, and/or risk factors Frequent monitoring for patients with diabetes to ensure blood sugar control Thyroid  Panel (TSH) Every 6 months based on history, symptoms, and risk factors May be repeated more often if on medication HIV One time testing for all patients 1 and older May be repeated more frequently for patients with increased risk factors or exposure Hepatitis C One time testing for all patients 9 and older May be repeated more frequently for patients with increased risk factors or exposure Gonorrhea, Chlamydia Every 12 months for all sexually active persons 13-24 years Additional monitoring may be recommended for those who are considered high risk or who have symptoms Every 12 months for any woman on birth control, regardless of sexual activity PSA Men 38-35 years old with risk factors Additional screening may be recommended from age 64-69 based on  risk factors, symptoms, and history  Vaccine Recommendations Tetanus Booster All adults every 10 years Flu Vaccine All patients 6  months and older every year COVID Vaccine All patients 12 years and older Initial dosing with booster May recommend additional booster based on age and health history HPV Vaccine 2 doses all patients age 31-26 Dosing may be considered for patients over 26 Shingles Vaccine (Shingrix) 2 doses all adults 55 years and older Pneumonia (Pneumovax 23) All adults 65 years and older May recommend earlier dosing based on health history One year apart from Prevnar 13 Pneumonia (Prevnar 80) All adults 65 years and older Dosed 1 year after Pneumovax 23 Pneumonia (Prevnar 20) One time alternative to the two dosing of 13 and 23 For all adults with initial dose of 23, 20 is recommended 1 year later For all adults with initial dose of 13, 23 is still recommended as second option 1 year later

## 2023-11-19 ENCOUNTER — Ambulatory Visit (INDEPENDENT_AMBULATORY_CARE_PROVIDER_SITE_OTHER): Payer: 59 | Admitting: Nurse Practitioner

## 2023-11-19 ENCOUNTER — Encounter: Payer: Self-pay | Admitting: Nurse Practitioner

## 2023-11-19 VITALS — BP 124/82 | HR 78 | Ht 62.0 in | Wt 174.8 lb

## 2023-11-19 DIAGNOSIS — Z1321 Encounter for screening for nutritional disorder: Secondary | ICD-10-CM | POA: Diagnosis not present

## 2023-11-19 DIAGNOSIS — Z13228 Encounter for screening for other metabolic disorders: Secondary | ICD-10-CM | POA: Diagnosis not present

## 2023-11-19 DIAGNOSIS — Z0000 Encounter for general adult medical examination without abnormal findings: Secondary | ICD-10-CM

## 2023-11-19 DIAGNOSIS — Z634 Disappearance and death of family member: Secondary | ICD-10-CM

## 2023-11-19 DIAGNOSIS — Z6831 Body mass index (BMI) 31.0-31.9, adult: Secondary | ICD-10-CM | POA: Diagnosis not present

## 2023-11-19 DIAGNOSIS — Z1329 Encounter for screening for other suspected endocrine disorder: Secondary | ICD-10-CM | POA: Diagnosis not present

## 2023-11-19 DIAGNOSIS — Z130 Encounter for screening for diseases of the blood and blood-forming organs and certain disorders involving the immune mechanism: Secondary | ICD-10-CM | POA: Diagnosis not present

## 2023-11-19 DIAGNOSIS — E7800 Pure hypercholesterolemia, unspecified: Secondary | ICD-10-CM | POA: Diagnosis not present

## 2023-11-19 DIAGNOSIS — D259 Leiomyoma of uterus, unspecified: Secondary | ICD-10-CM | POA: Diagnosis not present

## 2023-11-19 NOTE — Patient Instructions (Signed)
WEIGHT LOSS PLANNING  For best management of weight, it is vital to balance intake versus output. This means the number of calories burned per day must be less than the calories you take in with food and drink.   I recommend trying to follow a diet with the following: Calories: 1200-1500 calories per day Carbohydrates: 150-180 grams of carbohydrates per day  Why: Gives your body enough "quick fuel" for cells to maintain normal function without sending them into starvation mode.  Protein: At least 90 grams of protein per day- 30 grams with each meal Why: Protein takes longer and uses more energy than carbohydrates to break down for fuel. The carbohydrates in your meals serves as quick energy sources and proteins help use some of that extra quick energy to break down to produce long term energy. This helps you not feel hungry as quickly and protein breakdown burns calories.  Water: Drink AT LEAST 64 ounces of water per day  Why: Water is essential to healthy metabolism. Water helps to fill the stomach and keep you fuller longer. Water is required for healthy digestion and filtering of waste in the body.  Fat: Limit fats in your diet- when choosing fats, choose foods with lower fats content such as lean meats (chicken, fish, turkey).  Why: Increased fat intake leads to storage "for later". Once you burn your carbohydrate energy, your body goes into fat and protein breakdown mode to help you loose weight.  Cholesterol: Fats and oils that are LIQUID at room temperature are best. Choose vegetable oils (olive oil, avocado oil, nuts). Avoid fats that are SOLID at room temperature (animal fats, processed meats). Healthy fats are often found in whole grains, beans, nuts, seeds, and berries.  Why: Elevated cholesterol levels lead to build up of cholesterol on the inside of your blood vessels. This will eventually cause the blood vessels to become hard and can lead to high blood pressure and damage to your  organs. When the blood flow is reduced, but the pressure is high from cholesterol buildup, parts of the cholesterol can break off and form clots that can go to the brain or heart leading to a stroke or heart attack.  Fiber: Increase amount of SOLUBLE the fiber in your diet. This helps to fill you up, lowers cholesterol, and helps with digestion. Some foods high in soluble fiber are oats, peas, beans, apples, carrots, barley, and citrus fruits.   Why: Fiber fills you up, helps remove excess cholesterol, and aids in healthy digestion which are all very important in weight management.   I recommend the following as a minimum activity routine: Purposeful walk or other physical activity at least 20 minutes every single day. This means purposefully taking a walk, jog, bike, swim, treadmill, elliptical, dance, etc.  This activity should be ABOVE your normal daily activities, such as walking at work. Goal exercise should be at least 150 minutes a week- work your way up to this.   Heart Rate: Your maximum exercise heart rate should be 220 - Your Age in Years. When exercising, get your heart rate up, but avoid going over the maximum targeted heart rate.  60-70% of your maximum heart rate is where you tend to burn the most fat. To find this number:  220 - Age In Years= Max HR  Max HR x 0.6 (or 0.7) = Fat Burning HR The Fat Burning HR is your goal heart rate while working out to burn the most fat.  NEVER exercise to   the point your feel lightheaded, weak, nauseated, dizzy. If you experience ANY of these symptoms- STOP exercise! Allow yourself to cool down and your heart rate to come down. Then restart slower next time.  If at ANY TIME you feel chest pain or chest pressure during exercise, STOP IMMEDIATELY and seek medical attention.   

## 2023-11-19 NOTE — Assessment & Plan Note (Signed)
 Managed with diet and exercise. Will monitor today. Recommend increased physical activity and high fiber diet to best manage.

## 2023-11-19 NOTE — Assessment & Plan Note (Signed)
 CPE completed today. Review of HM activities and recommendations discussed and provided on AVS. Anticipatory guidance, diet, and exercise recommendations provided. Medications, allergies, and hx reviewed and updated as necessary. Orders placed as listed below.  Plan: - Labs ordered. Will make changes as necessary based on results.  - I will review these results and send recommendations via MyChart or a telephone call.  - F/U with CPE in 1 year or sooner for acute/chronic health needs as directed.  - Flu vaccine with work and Tdap with [facility]

## 2023-11-19 NOTE — Assessment & Plan Note (Signed)
 Recent weight gain addressed with dietary and physical activity recommendations. - Encourage portion control and a diet high in protein and fiber, low in carbohydrates. - Encourage regular physical activity, such as walking.

## 2023-11-19 NOTE — Assessment & Plan Note (Signed)
 Uterine fibroid identified earlier this year with sudden onset of vaginal bleeding. Evaluation with GYN completed. No current symptoms or concerns. Previous evaluation showed no need for hysterectomy. Recommend ongoing monitoring and close follow-up for any vaginal bleeding. She has a history of ablation, but hormone labs show she is post menopausal.

## 2023-11-19 NOTE — Assessment & Plan Note (Signed)
 Recent bereavement due to mother's passing. - Offer grief counseling services- she is considering [facility] counseling services offered.  - Encourage reaching out if grieving process becomes overwhelming.

## 2023-11-21 LAB — HEMOGLOBIN A1C
Hgb A1c MFr Bld: 5.2 % (ref <5.7)
Mean Plasma Glucose: 103 mg/dL
eAG (mmol/L): 5.7 mmol/L

## 2023-11-21 LAB — CBC WITH DIFFERENTIAL/PLATELET
Absolute Lymphocytes: 2283 {cells}/uL (ref 850–3900)
Absolute Monocytes: 577 {cells}/uL (ref 200–950)
Basophils Absolute: 40 {cells}/uL (ref 0–200)
Basophils Relative: 0.5 %
Eosinophils Absolute: 261 {cells}/uL (ref 15–500)
Eosinophils Relative: 3.3 %
HCT: 42 % (ref 35.0–45.0)
Hemoglobin: 13.9 g/dL (ref 11.7–15.5)
MCH: 29.7 pg (ref 27.0–33.0)
MCHC: 33.1 g/dL (ref 32.0–36.0)
MCV: 89.7 fL (ref 80.0–100.0)
MPV: 9.7 fL (ref 7.5–12.5)
Monocytes Relative: 7.3 %
Neutro Abs: 4740 {cells}/uL (ref 1500–7800)
Neutrophils Relative %: 60 %
Platelets: 333 Thousand/uL (ref 140–400)
RBC: 4.68 Million/uL (ref 3.80–5.10)
RDW: 12.7 % (ref 11.0–15.0)
Total Lymphocyte: 28.9 %
WBC: 7.9 Thousand/uL (ref 3.8–10.8)

## 2023-11-21 LAB — LIPID PANEL
Cholesterol: 200 mg/dL — ABNORMAL HIGH (ref <200)
HDL: 62 mg/dL (ref >=50)
LDL Cholesterol (Calc): 122 mg/dL — ABNORMAL HIGH
Non-HDL Cholesterol (Calc): 138 mg/dL — ABNORMAL HIGH (ref <130)
Total CHOL/HDL Ratio: 3.2 (calc) (ref <5.0)
Triglycerides: 70 mg/dL (ref <150)

## 2023-11-21 LAB — COMPLETE METABOLIC PANEL WITHOUT GFR
AG Ratio: 2 (calc) (ref 1.0–2.5)
ALT: 17 U/L (ref 6–29)
AST: 14 U/L (ref 10–35)
Albumin: 4.3 g/dL (ref 3.6–5.1)
Alkaline phosphatase (APISO): 72 U/L (ref 37–153)
BUN: 10 mg/dL (ref 7–25)
CO2: 27 mmol/L (ref 20–32)
Calcium: 9.1 mg/dL (ref 8.6–10.4)
Chloride: 105 mmol/L (ref 98–110)
Creat: 0.61 mg/dL (ref 0.50–1.03)
Globulin: 2.1 g/dL (ref 1.9–3.7)
Glucose, Bld: 88 mg/dL (ref 65–99)
Potassium: 4.6 mmol/L (ref 3.5–5.3)
Sodium: 141 mmol/L (ref 135–146)
Total Bilirubin: 0.6 mg/dL (ref 0.2–1.2)
Total Protein: 6.4 g/dL (ref 6.1–8.1)

## 2023-11-25 ENCOUNTER — Ambulatory Visit: Payer: Self-pay | Admitting: Nurse Practitioner

## 2024-12-06 ENCOUNTER — Encounter: Payer: Self-pay | Admitting: Nurse Practitioner
